# Patient Record
Sex: Female | Born: 1965 | Race: Black or African American | Hispanic: No | Smoking: Former smoker
Health system: Southern US, Community
[De-identification: ages and names within clinical notes are randomized; demographics above are authoritative.]

## PROBLEM LIST (undated history)

## (undated) DIAGNOSIS — C50919 Malignant neoplasm of unspecified site of unspecified female breast: Secondary | ICD-10-CM

## (undated) DIAGNOSIS — I10 Essential (primary) hypertension: Secondary | ICD-10-CM

## (undated) HISTORY — PX: FRACTURE SURGERY: SHX138

## (undated) HISTORY — PX: KNEE ARTHROSCOPY: SUR90

## (undated) HISTORY — PX: BREAST SURGERY: SHX581

## (undated) HISTORY — PX: ABDOMINAL HYSTERECTOMY: SHX81

---

## 2001-12-13 ENCOUNTER — Emergency Department (HOSPITAL_COMMUNITY): Admission: EM | Admit: 2001-12-13 | Discharge: 2001-12-13 | Payer: Self-pay | Admitting: Emergency Medicine

## 2002-03-22 HISTORY — PX: MASTECTOMY: SHX3

## 2002-03-22 HISTORY — PX: ABDOMINAL HYSTERECTOMY: SHX81

## 2002-09-17 DIAGNOSIS — Z9889 Other specified postprocedural states: Secondary | ICD-10-CM | POA: Insufficient documentation

## 2002-09-17 DIAGNOSIS — N993 Prolapse of vaginal vault after hysterectomy: Secondary | ICD-10-CM | POA: Insufficient documentation

## 2004-02-11 ENCOUNTER — Ambulatory Visit: Payer: Self-pay

## 2004-08-24 ENCOUNTER — Emergency Department: Payer: Self-pay | Admitting: Unknown Physician Specialty

## 2004-09-03 ENCOUNTER — Emergency Department: Payer: Self-pay | Admitting: Emergency Medicine

## 2005-03-30 ENCOUNTER — Emergency Department: Payer: Self-pay | Admitting: Internal Medicine

## 2005-06-23 ENCOUNTER — Emergency Department: Payer: Self-pay | Admitting: Emergency Medicine

## 2005-11-21 ENCOUNTER — Emergency Department: Payer: Self-pay

## 2006-06-08 ENCOUNTER — Emergency Department: Payer: Self-pay | Admitting: Emergency Medicine

## 2006-08-08 ENCOUNTER — Ambulatory Visit: Payer: Self-pay | Admitting: Family Medicine

## 2007-08-22 ENCOUNTER — Emergency Department: Payer: Self-pay | Admitting: Emergency Medicine

## 2007-11-05 ENCOUNTER — Emergency Department: Payer: Self-pay | Admitting: Emergency Medicine

## 2007-11-09 ENCOUNTER — Ambulatory Visit: Payer: Self-pay | Admitting: Surgery

## 2008-10-24 ENCOUNTER — Emergency Department: Payer: Self-pay

## 2009-02-25 ENCOUNTER — Ambulatory Visit: Payer: Self-pay | Admitting: Family Medicine

## 2009-04-10 ENCOUNTER — Ambulatory Visit: Payer: Self-pay | Admitting: Urology

## 2009-04-14 ENCOUNTER — Ambulatory Visit: Payer: Self-pay | Admitting: Urology

## 2009-05-27 ENCOUNTER — Emergency Department: Payer: Self-pay | Admitting: Emergency Medicine

## 2010-06-09 ENCOUNTER — Ambulatory Visit: Payer: Self-pay | Admitting: Anesthesiology

## 2010-06-09 ENCOUNTER — Ambulatory Visit: Payer: Self-pay | Admitting: Otolaryngology

## 2010-06-21 ENCOUNTER — Ambulatory Visit: Payer: Self-pay | Admitting: Internal Medicine

## 2012-01-26 ENCOUNTER — Emergency Department: Payer: Self-pay | Admitting: Emergency Medicine

## 2012-01-26 LAB — URINALYSIS, COMPLETE
Glucose,UR: NEGATIVE mg/dL (ref 0–75)
Leukocyte Esterase: NEGATIVE
Nitrite: NEGATIVE
Protein: NEGATIVE
RBC,UR: 1 /HPF (ref 0–5)
Specific Gravity: 1.025 (ref 1.003–1.030)
WBC UR: 2 /HPF (ref 0–5)

## 2012-01-26 LAB — CBC
MCH: 25.5 pg — ABNORMAL LOW (ref 26.0–34.0)
MCV: 79 fL — ABNORMAL LOW (ref 80–100)
Platelet: 178 10*3/uL (ref 150–440)
RDW: 15.3 % — ABNORMAL HIGH (ref 11.5–14.5)

## 2012-01-26 LAB — COMPREHENSIVE METABOLIC PANEL
Anion Gap: 8 (ref 7–16)
Calcium, Total: 9.4 mg/dL (ref 8.5–10.1)
Chloride: 105 mmol/L (ref 98–107)
Creatinine: 0.77 mg/dL (ref 0.60–1.30)
EGFR (African American): >60
EGFR (Non-African Amer.): >60
Glucose: 94 mg/dL (ref 65–99)
Potassium: 3.5 mmol/L (ref 3.5–5.1)
SGOT(AST): 12 U/L — ABNORMAL LOW (ref 15–37)
SGPT (ALT): 29 U/L (ref 12–78)
Total Protein: 8.3 g/dL — ABNORMAL HIGH (ref 6.4–8.2)

## 2012-01-26 LAB — LIPASE, BLOOD: Lipase: 242 U/L (ref 73–393)

## 2012-11-24 DIAGNOSIS — C50919 Malignant neoplasm of unspecified site of unspecified female breast: Secondary | ICD-10-CM | POA: Insufficient documentation

## 2013-04-03 ENCOUNTER — Ambulatory Visit: Payer: Self-pay | Admitting: Specialist

## 2013-05-21 ENCOUNTER — Ambulatory Visit: Payer: Self-pay | Admitting: Unknown Physician Specialty

## 2013-05-21 DIAGNOSIS — I10 Essential (primary) hypertension: Secondary | ICD-10-CM

## 2013-05-21 LAB — POTASSIUM: Potassium: 3.3 mmol/L — ABNORMAL LOW (ref 3.5–5.1)

## 2013-05-23 ENCOUNTER — Ambulatory Visit: Payer: Self-pay | Admitting: Specialist

## 2014-04-06 ENCOUNTER — Emergency Department: Payer: Self-pay | Admitting: Emergency Medicine

## 2014-04-06 LAB — BASIC METABOLIC PANEL
Anion Gap: 7 (ref 7–16)
BUN: 18 mg/dL (ref 7–18)
CHLORIDE: 101 mmol/L (ref 98–107)
Calcium, Total: 9.3 mg/dL (ref 8.5–10.1)
Co2: 33 mmol/L — ABNORMAL HIGH (ref 21–32)
Creatinine: 0.72 mg/dL (ref 0.60–1.30)
Glucose: 109 mg/dL — ABNORMAL HIGH (ref 65–99)
Osmolality: 284 (ref 275–301)
Potassium: 2.7 mmol/L — ABNORMAL LOW (ref 3.5–5.1)
SODIUM: 141 mmol/L (ref 136–145)

## 2014-04-06 LAB — CK: CK, TOTAL: 145 U/L (ref 26–192)

## 2014-07-10 ENCOUNTER — Emergency Department
Admit: 2014-07-10 | Disposition: A | Discharge status: Home / Self Care | Payer: Self-pay | Admitting: Emergency Medicine

## 2014-07-10 ENCOUNTER — Emergency Department: Admit: 2014-07-10 | Discharge status: Against Medical Advice | Payer: Self-pay | Admitting: Student

## 2014-07-10 LAB — CBC WITH DIFFERENTIAL/PLATELET
BASOS PCT: 1.1 %
Basophil #: 0.1 10*3/uL (ref 0.0–0.1)
EOS PCT: 2.1 %
Eosinophil #: 0.2 10*3/uL (ref 0.0–0.7)
HCT: 40 % (ref 35.0–47.0)
HGB: 13 g/dL (ref 12.0–16.0)
LYMPHS PCT: 39.7 %
Lymphocyte #: 3 10*3/uL (ref 1.0–3.6)
MCH: 25.3 pg — ABNORMAL LOW (ref 26.0–34.0)
MCHC: 32.6 g/dL (ref 32.0–36.0)
MCV: 78 fL — ABNORMAL LOW (ref 80–100)
Monocyte #: 0.4 x10 3/mm (ref 0.2–0.9)
Monocyte %: 5.9 %
NEUTROS ABS: 3.9 10*3/uL (ref 1.4–6.5)
Neutrophil %: 51.2 %
Platelet: 167 10*3/uL (ref 150–440)
RBC: 5.15 10*6/uL (ref 3.80–5.20)
RDW: 15.2 % — ABNORMAL HIGH (ref 11.5–14.5)
WBC: 7.5 10*3/uL (ref 3.6–11.0)

## 2014-07-10 LAB — COMPREHENSIVE METABOLIC PANEL
ALK PHOS: 85 U/L
ALT: 21 U/L
ANION GAP: 9 (ref 7–16)
AST: 20 U/L
Albumin: 4.7 g/dL
BUN: 16 mg/dL
Bilirubin,Total: 0.3 mg/dL
CALCIUM: 10.5 mg/dL — AB
Chloride: 99 mmol/L — ABNORMAL LOW
Co2: 32 mmol/L
Creatinine: 0.72 mg/dL
EGFR (Non-African Amer.): >60
GLUCOSE: 106 mg/dL — AB
POTASSIUM: 2.7 mmol/L — AB
SODIUM: 140 mmol/L
Total Protein: 8.6 g/dL — ABNORMAL HIGH

## 2014-07-13 NOTE — Op Note (Signed)
PATIENT NAME:  Jacqueline Bowman, Jacqueline Bowman MR#:  [id number] DATE OF BIRTH:  [birth year]  DATE OF PROCEDURE:  05/23/2013  PREOPERATIVE DIAGNOSES: 1.  Grade IV chondral defect, right medial femoral condyle. 2.  Extensive synovitis of the knee.  3.  Grade II chondromalacia, patellofemoral joint.   POSTOPERATIVE DIAGNOSES: 1.  Grade IV chondral defect, right medial femoral condyle. 2.  Extensive synovitis of the knee.  3.  Grade II chondromalacia, patellofemoral joint.   PROCEDURES: 1.  Microfracture, right medial femoral condyle.  2.  Extensive synovectomy, right knee.   SURGEON: Park Breed, MD.   ANESTHESIA: General LMA.   COMPLICATIONS: None.   DRAINS: None.   ESTIMATED BLOOD LOSS: Minimal.  REPLACEMENTS:  None.   OPERATIVE FINDINGS: The patient had a 2 x 2 cm grade IV chondral lesion in a weight-bearing surface on the medial femoral condyle. There was extensive synovitis anteriorly and with plica medially. The patellofemoral joint had grade II chondromalacia. The menisci were intact. The lateral compartment was intact. There were no large loose bodies other than loose floating bodies of chondral material. The suprapatellar pouch was intact. Gutters were intact.   OPERATIVE PROCEDURE: The patient was brought to the operating room where she underwent satisfactory general LMA anesthesia in the supine position. The right knee was prepped and draped in sterile fashion and arthroscopy carried out through standard portals. The above findings were encountered upon arthroscopy. A great deal of time was taken to clear out the anterior portion of the knee anteriorly and laterally and medially. Medial plica was resected. There was a large full-thickness chondral defect in the medial femoral condyle. After surveying the knee and finishing synovectomy, the patellofemoral joint was lightly touched up with the ArthroCare wand at the lowest setting. The edges of the chondral defect in the medial femoral  condyle were shaved back to stable tissue. The 0.62 K wire was then used to drill multiple holes in condyle which provided good bleeding bone. After this had been completed and pictures taken, the knee was again irrigated. The stab wounds were closed with 3-0 nylon suture. Marcaine 0.5% with epinephrine and morphine was placed in the joint. Dry sterile dressing was applied. The tourniquet was not used. The patient was awakened and taken to recovery in good condition.    ____________________________ Park Breed, MD hem:ce D: 05/23/2013 09:13:47 ET T: 05/23/2013 14:21:39 ET JOB#: [id number]  cc: Park Breed, MD, <Dictator> Park Breed MD ELECTRONICALLY SIGNED 05/24/2013 7:40

## 2015-02-02 ENCOUNTER — Encounter: Payer: Self-pay | Admitting: *Deleted

## 2015-02-02 ENCOUNTER — Emergency Department: Payer: Private Health Insurance - Indemnity

## 2015-02-02 ENCOUNTER — Emergency Department
Admission: EM | Admit: 2015-02-02 | Discharge: 2015-02-02 | Disposition: A | Discharge status: Home / Self Care | Payer: Private Health Insurance - Indemnity | Attending: Emergency Medicine | Admitting: Emergency Medicine

## 2015-02-02 DIAGNOSIS — S0990XA Unspecified injury of head, initial encounter: Secondary | ICD-10-CM

## 2015-02-02 DIAGNOSIS — Y9389 Activity, other specified: Secondary | ICD-10-CM | POA: Diagnosis not present

## 2015-02-02 DIAGNOSIS — Y92410 Unspecified street and highway as the place of occurrence of the external cause: Secondary | ICD-10-CM | POA: Insufficient documentation

## 2015-02-02 DIAGNOSIS — Y998 Other external cause status: Secondary | ICD-10-CM | POA: Insufficient documentation

## 2015-02-02 HISTORY — DX: Malignant neoplasm of unspecified site of unspecified female breast: C50.919

## 2015-02-02 MED ORDER — IBUPROFEN 800 MG PO TABS: 800.0000 mg | ORAL_TABLET | Freq: Three times a day (TID) | ORAL | Status: DC | PRN

## 2015-02-02 MED ORDER — OXYCODONE-ACETAMINOPHEN 5-325 MG PO TABS
2.0000 | ORAL_TABLET | Freq: Once | ORAL | Status: AC
  Administered 2015-02-02: 2 via ORAL
  Filled 2015-02-02: qty 2
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
## 2015-02-02 NOTE — Discharge Instructions (Signed)
Head Injury, Adult °You have a head injury. Headaches and throwing up (vomiting) are common after a head injury. It should be easy to wake up from sleeping. Sometimes you must stay in the hospital. Most problems happen within the first 24 hours. Side effects may occur up to 7-10 days after the injury.  °WHAT ARE THE TYPES OF HEAD INJURIES? °Head injuries can be as minor as a bump. Some head injuries can be more severe. More severe head injuries include: °· A jarring injury to the brain (concussion). °· A bruise of the brain (contusion). This mean there is bleeding in the brain that can cause swelling. °· A cracked skull (skull fracture). °· Bleeding in the brain that collects, clots, and forms a bump (hematoma). °WHEN SHOULD I GET HELP RIGHT AWAY?  °· You are confused or sleepy. °· You cannot be woken up. °· You feel sick to your stomach (nauseous) or keep throwing up (vomiting). °· Your dizziness or unsteadiness is getting worse. °· You have very bad, lasting headaches that are not helped by medicine. Take medicines only as told by your doctor. °· You cannot use your arms or legs like normal. °· You cannot walk. °· You notice changes in the black spots in the center of the colored part of your eye (pupil). °· You have clear or bloody fluid coming from your nose or ears. °· You have trouble seeing. °During the next 24 hours after the injury, you must stay with someone who can watch you. This person should get help right away (call 911 in the [place] if you start to shake and are not able to control it (have seizures), you pass out, or you are unable to wake up. °HOW CAN I PREVENT A HEAD INJURY IN THE FUTURE? °· Wear seat belts. °· Wear a helmet while bike riding and playing sports like football. °· Stay away from dangerous activities around the house. °WHEN CAN I RETURN TO NORMAL ACTIVITIES AND ATHLETICS? °See your doctor before doing these activities. You should not do normal activities or play contact sports until 1  week after the following symptoms have stopped: °· Headache that does not go away. °· Dizziness. °· Poor attention. °· Confusion. °· Memory problems. °· Sickness to your stomach or throwing up. °· Tiredness. °· Fussiness. °· Bothered by bright lights or loud noises. °· Anxiousness or depression. °· Restless sleep. °MAKE SURE YOU:  °· Understand these instructions. °· Will watch your condition. °· Will get help right away if you are not doing well or get worse. °  °This information is not intended to replace advice given to you by your health care provider. Make sure you discuss any questions you have with your health care provider. °  °Document Released: 02/19/2008 Document Revised: 03/29/2014 Document Reviewed: 11/13/2012 °Elsevier Interactive Patient Education ©2016 [employer] ° °

## 2015-02-14 ENCOUNTER — Emergency Department
Admission: EM | Admit: 2015-02-14 | Discharge: 2015-02-14 | Disposition: A | Discharge status: Home / Self Care | Payer: Private Health Insurance - Indemnity | Attending: Emergency Medicine | Admitting: Emergency Medicine

## 2015-02-14 ENCOUNTER — Emergency Department: Payer: Private Health Insurance - Indemnity

## 2015-02-14 DIAGNOSIS — Z96651 Presence of right artificial knee joint: Secondary | ICD-10-CM | POA: Diagnosis not present

## 2015-02-14 DIAGNOSIS — M25561 Pain in right knee: Secondary | ICD-10-CM | POA: Insufficient documentation

## 2015-02-14 MED ORDER — OXYCODONE-ACETAMINOPHEN 7.5-325 MG PO TABS
1.0000 | ORAL_TABLET | ORAL | Status: DC | PRN
Start: 2015-02-14 — End: 2016-11-22

## 2015-02-14 MED ORDER — OXYCODONE-ACETAMINOPHEN 5-325 MG PO TABS
2.0000 | ORAL_TABLET | Freq: Once | ORAL | Status: AC
  Administered 2015-02-14: 2 via ORAL
  Filled 2015-02-14: qty 2

## 2015-02-14 NOTE — ED Provider Notes (Signed)
[facility] Emergency Department Provider Note  ____________________________________________  Time seen: Approximately 12:30 PM  I have reviewed the triage vital signs and the nursing notes.   HISTORY  Chief Complaint Knee Pain    HPI Jacqueline Bowman is a 49 y.o. female patient complains of 2 weeks of right knee pain. Patient stated the pain is on the lateral aspect of her knee. She state she's had a knee replacement 8 months ago. Patient states sensation of loose body in the lateral aspect of her knee. knee. Patient denies any recent trauma. Patient rates the pain as a 9/10. No palliative measures taken for this complaint.  Past Medical History  Diagnosis Date  . Breast cancer [facility])     January 2001    There are no active problems to display for this patient.   Past Surgical History  Procedure Laterality Date  . Knee arthroscopy    . Fracture surgery    . Abdominal hysterectomy      Current Outpatient Rx  Name  Route  Sig  Dispense  Refill  . ibuprofen (ADVIL,MOTRIN) 800 MG tablet   Oral   Take 1 tablet (800 mg total) by mouth every 8 (eight) hours as needed.   30 tablet   0     Allergies Review of patient's allergies indicates no known allergies.  No family history on file.  Social History Social History  Substance Use Topics  . Smoking status: Never Smoker   . Smokeless tobacco: None  . Alcohol Use: No     Comment: socially    Review of Systems Constitutional: No fever/chills Eyes: No visual changes. ENT: No sore throat. Cardiovascular: Denies chest pain. Respiratory: Denies shortness of breath. Gastrointestinal: No abdominal pain.  No nausea, no vomiting.  No diarrhea.  No constipation. Genitourinary: Negative for dysuria. Musculoskeletal: Right knee pain  Skin: Negative for rash. Neurological: Negative for headaches, focal weakness or numbness. 10-point ROS otherwise  negative.  ____________________________________________   PHYSICAL EXAM:  VITAL SIGNS: ED Triage Vitals  Enc Vitals Group     BP 02/14/15 1146 113/77 mmHg     Pulse Rate 02/14/15 1146 94     Resp 02/14/15 1146 18     Temp 02/14/15 1146 97.8 F (36.6 C)     Temp Source 02/14/15 1146 Oral     SpO2 02/14/15 1146 98 %     Weight 02/14/15 1142 206 lb (93.441 kg)     Height 02/14/15 1142 5\' 5"  (1.651 m)     Head Cir --      Peak Flow --      Pain Score 02/14/15 1142 9     Pain Loc --      Pain Edu? --      Excl. in [place]? --     Constitutional: Alert and oriented. Well appearing and in no acute distress. Eyes: Conjunctivae are normal. PERRL. EOMI. Head: Atraumatic. Nose: No congestion/rhinnorhea. Mouth/Throat: Mucous membranes are moist.  Oropharynx non-erythematous. Neck: No stridor. No cervical spine tenderness to palpation. Hematological/Lymphatic/Immunilogical: No cervical lymphadenopathy. Cardiovascular: Normal rate, regular rhythm. Grossly normal heart sounds.  Good peripheral circulation. Respiratory: Normal respiratory effort.  No retractions. Lungs CTAB. Gastrointestinal: Soft and nontender. No distention. No abdominal bruits. No CVA tenderness. Musculoskeletal: No DEFORMITY of the right knee. Surgical scar consistent with history. Patient is tender palpation lateral insertion point of the collateral ligament.  Neurologic:  Normal speech and language. No gross focal neurologic deficits are appreciated. No gait instability.  Skin:  Skin is warm, dry and intact. No rash noted. Psychiatric: Mood and affect are normal. Speech and behavior are normal.  ____________________________________________   LABS (all labs ordered are listed, but only abnormal results are displayed)  Labs Reviewed - No data to display ____________________________________________  EKG   ____________________________________________  RADIOLOGY No acute findings on right and the x-ray. Radiologist  cannot rule out loose bodies seen at person films.Hardware appears to be intact. I, Sable Feil, personally viewed and evaluated these images (plain radiographs) as part of my medical decision making.    ____________________________________________   PROCEDURES  Procedure(s) performed: None  Critical Care performed: No  ____________________________________________   INITIAL IMPRESSION / ASSESSMENT AND PLAN / ED COURSE  Pertinent labs & imaging results that were available during my care of the patient were reviewed by me and considered in my medical decision making (see chart for details).  ___Right knee pain status post knee replacement. Discussed x-ray findings with patient. Patient given a CD of her x-ray to take to a treatment orthopedics for definitive evaluation and treatment. _________________________________________   FINAL CLINICAL IMPRESSION(S) / ED DIAGNOSES  Final diagnoses:  Right knee pain      Sable Feil, PA-C 02/14/15 [place], MD 02/15/15 [phone]

## 2015-02-14 NOTE — Discharge Instructions (Signed)
Advised to follow-up with treatment orthopedic Dr.

## 2015-02-14 NOTE — ED Notes (Signed)
Pt given copy of disc at discharge.

## 2015-02-14 NOTE — ED Notes (Signed)
Pt c/o right knee pain for the past 2 weeks. Denies injury

## 2015-04-11 DIAGNOSIS — Z1501 Genetic susceptibility to malignant neoplasm of breast: Secondary | ICD-10-CM | POA: Insufficient documentation

## 2015-06-01 ENCOUNTER — Encounter: Payer: Self-pay | Admitting: Emergency Medicine

## 2015-06-01 ENCOUNTER — Emergency Department
Admission: EM | Admit: 2015-06-01 | Discharge: 2015-06-01 | Disposition: A | Discharge status: Home / Self Care | Payer: Managed Care, Other (non HMO) | Attending: Emergency Medicine | Admitting: Emergency Medicine

## 2015-06-01 DIAGNOSIS — F1110 Opioid abuse, uncomplicated: Secondary | ICD-10-CM | POA: Insufficient documentation

## 2015-06-01 DIAGNOSIS — N12 Tubulo-interstitial nephritis, not specified as acute or chronic: Secondary | ICD-10-CM

## 2015-06-01 DIAGNOSIS — R319 Hematuria, unspecified: Secondary | ICD-10-CM | POA: Diagnosis present

## 2015-06-01 LAB — CBC WITH DIFFERENTIAL/PLATELET
Basophils Absolute: 0.1 10*3/uL (ref 0–0.1)
Basophils Relative: 1 %
EOS ABS: 0 10*3/uL (ref 0–0.7)
EOS PCT: 0 %
HEMATOCRIT: 37.3 % (ref 35.0–47.0)
Hemoglobin: 12 g/dL (ref 12.0–16.0)
Lymphocytes Relative: 17 %
Lymphs Abs: 1.6 10*3/uL (ref 1.0–3.6)
MCH: 24.5 pg — ABNORMAL LOW (ref 26.0–34.0)
MCHC: 32.1 g/dL (ref 32.0–36.0)
MCV: 76.3 fL — ABNORMAL LOW (ref 80.0–100.0)
MONOS PCT: 9 %
Monocytes Absolute: 0.8 10*3/uL (ref 0.2–0.9)
Neutro Abs: 7 10*3/uL — ABNORMAL HIGH (ref 1.4–6.5)
Neutrophils Relative %: 73 %
Platelets: 193 10*3/uL (ref 150–440)
RBC: 4.89 MIL/uL (ref 3.80–5.20)
RDW: 15 % — ABNORMAL HIGH (ref 11.5–14.5)
WBC: 9.6 10*3/uL (ref 3.6–11.0)

## 2015-06-01 LAB — COMPREHENSIVE METABOLIC PANEL
ALBUMIN: 4.2 g/dL (ref 3.5–5.0)
ALK PHOS: 111 U/L (ref 38–126)
ALT: 41 U/L (ref 14–54)
ANION GAP: 10 (ref 5–15)
AST: 42 U/L — ABNORMAL HIGH (ref 15–41)
BILIRUBIN TOTAL: 0.4 mg/dL (ref 0.3–1.2)
BUN: 14 mg/dL (ref 6–20)
CALCIUM: 9.2 mg/dL (ref 8.9–10.3)
CO2: 31 mmol/L (ref 22–32)
Chloride: 93 mmol/L — ABNORMAL LOW (ref 101–111)
Creatinine, Ser: 0.97 mg/dL (ref 0.44–1.00)
GFR calc non Af Amer: >60 mL/min (ref >60)
GLUCOSE: 114 mg/dL — AB (ref 65–99)
POTASSIUM: 2.4 mmol/L — AB (ref 3.5–5.1)
SODIUM: 134 mmol/L — AB (ref 135–145)
TOTAL PROTEIN: 8.9 g/dL — AB (ref 6.5–8.1)

## 2015-06-01 LAB — URINALYSIS COMPLETE WITH MICROSCOPIC (ARMC ONLY)
Bilirubin Urine: NEGATIVE
Glucose, UA: NEGATIVE mg/dL
KETONES UR: NEGATIVE mg/dL
Nitrite: POSITIVE — AB
PROTEIN: 30 mg/dL — AB
Specific Gravity, Urine: 1.01 (ref 1.005–1.030)
pH: 6 (ref 5.0–8.0)

## 2015-06-01 LAB — LIPASE, BLOOD: Lipase: 17 U/L (ref 11–51)

## 2015-06-01 MED ORDER — HYDROMORPHONE HCL 1 MG/ML IJ SOLN
1.0000 mg | Freq: Once | INTRAMUSCULAR | Status: AC
  Administered 2015-06-01: 1 mg via INTRAVENOUS
  Filled 2015-06-01: qty 1

## 2015-06-01 MED ORDER — POTASSIUM CHLORIDE CRYS ER 20 MEQ PO TBCR
60.0000 meq | EXTENDED_RELEASE_TABLET | Freq: Once | ORAL | Status: AC
  Administered 2015-06-01: 60 meq via ORAL
  Filled 2015-06-01: qty 3

## 2015-06-01 MED ORDER — DEXTROSE 5 % IV SOLN
1.0000 g | Freq: Once | INTRAVENOUS | Status: AC
  Administered 2015-06-01: 1 g via INTRAVENOUS
  Filled 2015-06-01: qty 10

## 2015-06-01 MED ORDER — ONDANSETRON HCL 4 MG/2ML IJ SOLN
4.0000 mg | Freq: Once | INTRAMUSCULAR | Status: AC
  Administered 2015-06-01: 4 mg via INTRAVENOUS
  Filled 2015-06-01: qty 2

## 2015-06-01 MED ORDER — SODIUM CHLORIDE 0.9 % IV BOLUS (SEPSIS)
1000.0000 mL | Freq: Once | INTRAVENOUS | Status: AC
  Administered 2015-06-01: 1000 mL via INTRAVENOUS

## 2015-06-01 MED ORDER — ONDANSETRON 4 MG PO TBDP: 4.0000 mg | ORAL_TABLET | Freq: Three times a day (TID) | ORAL | Status: DC | PRN

## 2015-06-01 MED ORDER — SULFAMETHOXAZOLE-TRIMETHOPRIM 800-160 MG PO TABS: 1.0000 | ORAL_TABLET | Freq: Two times a day (BID) | ORAL | Status: DC

## 2015-06-01 NOTE — ED Notes (Addendum)
Pt potassium level 2.4. MD notified.

## 2015-06-01 NOTE — Discharge Instructions (Signed)

## 2015-06-01 NOTE — ED Provider Notes (Addendum)
[facility] Emergency Department Provider Note  ____________________________________________  Time seen: 8:20 AM  I have reviewed the triage vital signs and the nursing notes.   HISTORY  Chief Complaint Hematuria    HPI CORISSA PUCCINI is a 50 y.o. female who complains of hematuria and left flank pain for the past 4 days. Also had a subjective fever yesterday. Also having some urinary urgency and dysuria. 2 weeks ago she tried taking a sitz bath as a cleansing regimen and use baking soda and salt water in the bath. She thinks that the symptoms started as a result of that. Has some nausea but no vomiting, no change in bowel habits. No aggravating or alleviating factors, nonradiating. Moderate intensity.   No blood thinner use  Past Medical History  Diagnosis Date  . Breast cancer [facility])     January 2001     There are no active problems to display for this patient.    Past Surgical History  Procedure Laterality Date  . Knee arthroscopy    . Fracture surgery    . Abdominal hysterectomy       Current Outpatient Rx  Name  Route  Sig  Dispense  Refill  . ibuprofen (ADVIL,MOTRIN) 800 MG tablet   Oral   Take 1 tablet (800 mg total) by mouth every 8 (eight) hours as needed.   30 tablet   0   . ondansetron (ZOFRAN ODT) 4 MG disintegrating tablet   Oral   Take 1 tablet (4 mg total) by mouth every 8 (eight) hours as needed for nausea or vomiting.   20 tablet   0   . oxyCODONE-acetaminophen (PERCOCET) 7.5-325 MG tablet   Oral   Take 1 tablet by mouth every 4 (four) hours as needed for severe pain.   20 tablet   0   . sulfamethoxazole-trimethoprim (BACTRIM DS) 800-160 MG tablet   Oral   Take 1 tablet by mouth 2 (two) times daily.   14 tablet   0      Allergies Review of patient's allergies indicates no known allergies.   History reviewed. No pertinent family history.  Social History Social History  Substance Use Topics  . Smoking  status: Never Smoker   . Smokeless tobacco: None  . Alcohol Use: No     Comment: socially    Review of Systems  Constitutional:  Positive fever.. No weight changes Eyes:   No blurry vision or double vision.  ENT:   No sore throat.  Cardiovascular:   No chest pain. Respiratory:   No dyspnea or cough. Gastrointestinal:   Negative for abdominal pain, vomiting and diarrhea.  Chronic occasional bleeding with bowel movements due to straining and hemorrhoids and chronic opioid use.. Genitourinary:   Positive dysuria urgency and hematuria. Musculoskeletal:   Negative for back pain. No joint swelling or pain. Skin:   Negative for rash. Neurological:   Negative for headaches, focal weakness or numbness. Psychiatric:  No anxiety or depression.   Endocrine:  No changes in energy or sleep difficulty.  10-point ROS otherwise negative.  ____________________________________________   PHYSICAL EXAM:  VITAL SIGNS: ED Triage Vitals  Enc Vitals Group     BP 06/01/15 0819 122/85 mmHg     Pulse Rate 06/01/15 0819 90     Resp 06/01/15 0819 20     Temp 06/01/15 0819 98.2 F (36.8 C)     Temp Source 06/01/15 0819 Oral     SpO2 06/01/15 0819 99 %  Weight 06/01/15 0819 190 lb (86.183 kg)     Height 06/01/15 0819 5\' 6"  (1.676 m)     Head Cir --      Peak Flow --      Pain Score 06/01/15 0819 7     Pain Loc --      Pain Edu? --      Excl. in [place]? --     Vital signs reviewed, nursing assessments reviewed.   Constitutional:   Alert and oriented. Well appearing and in no distress. Eyes:   No scleral icterus. No conjunctival pallor. PERRL. EOMI ENT   Head:   Normocephalic and atraumatic.   Nose:   No congestion/rhinnorhea. No septal hematoma   Mouth/Throat:   MMM, no pharyngeal erythema. No peritonsillar mass.    Neck:   No stridor. No SubQ emphysema. No meningismus. Hematological/Lymphatic/Immunilogical:   No cervical lymphadenopathy. Cardiovascular:   RRR. Symmetric  bilateral radial and DP pulses.  No murmurs.  Respiratory:   Normal respiratory effort without tachypnea nor retractions. Breath sounds are clear and equal bilaterally. No wheezes/rales/rhonchi. Gastrointestinal:   Soft With diffuse lower abdominal tenderness. Non distended. There is left CVA tenderness.  No rebound, rigidity, or guarding. Genitourinary:   deferred Musculoskeletal:   Nontender with normal range of motion in all extremities. No joint effusions.  No lower extremity tenderness.  No edema. Neurologic:   Normal speech and language.  CN 2-10 normal. Motor grossly intact. No gross focal neurologic deficits are appreciated.  Skin:    Skin is warm, dry and intact. No rash noted.  No petechiae, purpura, or bullae. Psychiatric:   Mood and affect are normal. ____________________________________________    LABS (pertinent positives/negatives) (all labs ordered are listed, but only abnormal results are displayed) Labs Reviewed  COMPREHENSIVE METABOLIC PANEL - Abnormal; Notable for the following:    Sodium 134 (*)    Potassium 2.4 (*)    Chloride 93 (*)    Glucose, Bld 114 (*)    Total Protein 8.9 (*)    AST 42 (*)    All other components within normal limits  CBC WITH DIFFERENTIAL/PLATELET - Abnormal; Notable for the following:    MCV 76.3 (*)    MCH 24.5 (*)    RDW 15.0 (*)    Neutro Abs 7.0 (*)    All other components within normal limits  URINALYSIS COMPLETEWITH MICROSCOPIC ([facility] ONLY) - Abnormal; Notable for the following:    Color, Urine YELLOW (*)    APPearance CLOUDY (*)    Hgb urine dipstick 3+ (*)    Protein, ur 30 (*)    Nitrite POSITIVE (*)    Leukocytes, UA 3+ (*)    Bacteria, UA FEW (*)    Squamous Epithelial / LPF 0-5 (*)    All other components within normal limits  URINE CULTURE  LIPASE, BLOOD   ____________________________________________   EKG    ____________________________________________     RADIOLOGY    ____________________________________________   PROCEDURES   ____________________________________________   INITIAL IMPRESSION / ASSESSMENT AND PLAN / ED COURSE  Pertinent labs & imaging results that were available during my care of the patient were reviewed by me and considered in my medical decision making (see chart for details).  Patient presents with urinary complaints and left flank pain with CVA tenderness. Suspect urinary tract infection, possible pyelonephritis although her vital signs are normal and there is no evidence of sepsis. It's and urinalysis. If negative, but with significant abdominal tenderness we'll proceed to  CT scan. Medical screening examination/treatment/procedure(s) were performed by non-physician practitioner and as supervising physician I was immediately available for consultation/collaboration.    ----------------------------------------- 10:05 AM on 06/01/2015 -----------------------------------------  Workup reveals hypokalemia which was repleted here in the ED as well as a clear urinary tract infection. With her symptoms this is consistent with left-sided pyelonephritis although her vital signs are normal she is well appearing and tolerating oral intake. Patient given IV ceftriaxone in the ED. We'll prescribe a course of Bactrim and Zofran and have her follow up with primary care.   ____________________________________________   FINAL CLINICAL IMPRESSION(S) / ED DIAGNOSES  Final diagnoses:  Pyelonephritis      Carrie Mew, MD 06/01/15 [place], MD 06/01/15 1005

## 2015-06-01 NOTE — ED Notes (Signed)
Reports blood in urine and left flank pain.  States yesterday had fever.

## 2015-06-01 NOTE — ED Notes (Signed)
Pt rated pain 8/10 upon discharge. Pt given pain med before leaving. Pt has a ride.

## 2015-06-03 LAB — URINE CULTURE: Culture: >=100000

## 2015-06-18 ENCOUNTER — Encounter: Payer: Self-pay | Admitting: Emergency Medicine

## 2015-06-18 ENCOUNTER — Emergency Department
Admission: EM | Admit: 2015-06-18 | Discharge: 2015-06-18 | Disposition: A | Discharge status: Home / Self Care | Payer: Managed Care, Other (non HMO) | Attending: Emergency Medicine | Admitting: Emergency Medicine

## 2015-06-18 ENCOUNTER — Emergency Department: Payer: Managed Care, Other (non HMO)

## 2015-06-18 DIAGNOSIS — E876 Hypokalemia: Secondary | ICD-10-CM | POA: Diagnosis not present

## 2015-06-18 DIAGNOSIS — R0600 Dyspnea, unspecified: Secondary | ICD-10-CM | POA: Diagnosis not present

## 2015-06-18 DIAGNOSIS — M79604 Pain in right leg: Secondary | ICD-10-CM | POA: Diagnosis not present

## 2015-06-18 DIAGNOSIS — I10 Essential (primary) hypertension: Secondary | ICD-10-CM | POA: Insufficient documentation

## 2015-06-18 DIAGNOSIS — R0602 Shortness of breath: Secondary | ICD-10-CM | POA: Diagnosis present

## 2015-06-18 HISTORY — DX: Essential (primary) hypertension: I10

## 2015-06-18 LAB — URINALYSIS COMPLETE WITH MICROSCOPIC (ARMC ONLY)
Bilirubin Urine: NEGATIVE
GLUCOSE, UA: NEGATIVE mg/dL
HGB URINE DIPSTICK: NEGATIVE
Ketones, ur: NEGATIVE mg/dL
Nitrite: NEGATIVE
PH: 7 (ref 5.0–8.0)
Protein, ur: NEGATIVE mg/dL
Specific Gravity, Urine: 1.016 (ref 1.005–1.030)

## 2015-06-18 LAB — BASIC METABOLIC PANEL
Anion gap: 7 (ref 5–15)
BUN: 16 mg/dL (ref 6–20)
CALCIUM: 9.6 mg/dL (ref 8.9–10.3)
CHLORIDE: 96 mmol/L — AB (ref 101–111)
CO2: 32 mmol/L (ref 22–32)
CREATININE: 0.86 mg/dL (ref 0.44–1.00)
GFR calc non Af Amer: >60 mL/min (ref >60)
GLUCOSE: 100 mg/dL — AB (ref 65–99)
Potassium: 2.4 mmol/L — CL (ref 3.5–5.1)
Sodium: 135 mmol/L (ref 135–145)

## 2015-06-18 LAB — CBC
HCT: 37.2 % (ref 35.0–47.0)
Hemoglobin: 12.2 g/dL (ref 12.0–16.0)
MCH: 24.9 pg — AB (ref 26.0–34.0)
MCHC: 32.8 g/dL (ref 32.0–36.0)
MCV: 75.9 fL — AB (ref 80.0–100.0)
PLATELETS: 179 10*3/uL (ref 150–440)
RBC: 4.91 MIL/uL (ref 3.80–5.20)
RDW: 15.4 % — ABNORMAL HIGH (ref 11.5–14.5)
WBC: 5.3 10*3/uL (ref 3.6–11.0)

## 2015-06-18 LAB — BRAIN NATRIURETIC PEPTIDE: B NATRIURETIC PEPTIDE 5: 6 pg/mL (ref 0.0–100.0)

## 2015-06-18 LAB — TROPONIN I

## 2015-06-18 MED ORDER — IOPAMIDOL (ISOVUE-370) INJECTION 76%
75.0000 mL | Freq: Once | INTRAVENOUS | Status: AC | PRN
  Administered 2015-06-18: 75 mL via INTRAVENOUS

## 2015-06-18 MED ORDER — POTASSIUM CHLORIDE ER 10 MEQ PO TBCR: 10.0000 meq | EXTENDED_RELEASE_TABLET | Freq: Every day | ORAL | Status: DC

## 2015-06-18 MED ORDER — POTASSIUM CHLORIDE CRYS ER 20 MEQ PO TBCR
60.0000 meq | EXTENDED_RELEASE_TABLET | Freq: Once | ORAL | Status: AC
  Administered 2015-06-18: 60 meq via ORAL
  Filled 2015-06-18: qty 3

## 2015-06-18 NOTE — ED Notes (Addendum)
Surgery to right leg x 2 weeks ago; cramping intermittently since. Pt concerned about blood clot.  Pt also finishing antibiotics for UTI.

## 2015-06-18 NOTE — Discharge Instructions (Signed)
Shortness of Breath Shortness of breath means you have trouble breathing. It could also mean that you have a medical problem. You should get immediate medical care for shortness of breath. CAUSES   Not enough oxygen in the air such as with high altitudes or a smoke-filled room.  Certain lung diseases, infections, or problems.  Heart disease or conditions, such as angina or heart failure.  Low red blood cells (anemia).  Poor physical fitness, which can cause shortness of breath when you exercise.  Chest or back injuries or stiffness.  Being overweight.  Smoking.  Anxiety, which can make you feel like you are not getting enough air. DIAGNOSIS  Serious medical problems can often be found during your physical exam. Tests may also be done to determine why you are having shortness of breath. Tests may include:  Chest X-rays.  Lung function tests.  Blood tests.  An electrocardiogram (ECG).  An ambulatory electrocardiogram. An ambulatory ECG records your heartbeat patterns over a 24-hour period.  Exercise testing.  A transthoracic echocardiogram (TTE). During echocardiography, sound waves are used to evaluate how blood flows through your heart.  A transesophageal echocardiogram (TEE).  Imaging scans. Your health care provider may not be able to find a cause for your shortness of breath after your exam. In this case, it is important to have a follow-up exam with your health care provider as directed.  TREATMENT  Treatment for shortness of breath depends on the cause of your symptoms and can vary greatly. HOME CARE INSTRUCTIONS   Do not smoke. Smoking is a common cause of shortness of breath. If you smoke, ask for help to quit.  Avoid being around chemicals or things that may bother your breathing, such as paint fumes and dust.  Rest as needed. Slowly resume your usual activities.  If medicines were prescribed, take them as directed for the full length of time directed. This  includes oxygen and any inhaled medicines.  Keep all follow-up appointments as directed by your health care provider. SEEK MEDICAL CARE IF:   Your condition does not improve in the time expected.  You have a hard time doing your normal activities even with rest.  You have any new symptoms. SEEK IMMEDIATE MEDICAL CARE IF:   Your shortness of breath gets worse.  You feel light-headed, faint, or develop a cough not controlled with medicines.  You start coughing up blood.  You have pain with breathing.  You have chest pain or pain in your arms, shoulders, or abdomen.  You have a fever.  You are unable to walk up stairs or exercise the way you normally do. MAKE SURE YOU:  Understand these instructions.  Will watch your condition.  Will get help right away if you are not doing well or get worse.   This information is not intended to replace advice given to you by your health care provider. Make sure you discuss any questions you have with your health care provider.   Document Released: 12/01/2000 Document Revised: 03/13/2013 Document Reviewed: 05/24/2011 Elsevier Interactive Patient Education 2016 [employer].  Hypokalemia Hypokalemia means that the amount of potassium in the blood is lower than normal.Potassium is a chemical, called an electrolyte, that helps regulate the amount of fluid in the body. It also stimulates muscle contraction and helps nerves function properly.Most of the body's potassium is inside of cells, and only a very small amount is in the blood. Because the amount in the blood is so small, minor changes can be  life-threatening. CAUSES  Antibiotics.  Diarrhea or vomiting.  Using laxatives too much, which can cause diarrhea.  Chronic kidney disease.  Water pills (diuretics).  Eating disorders (bulimia).  Low magnesium level.  Sweating a lot. SIGNS AND SYMPTOMS  Weakness.  Constipation.  Fatigue.  Muscle cramps.  Mental  confusion.  Skipped heartbeats or irregular heartbeat (palpitations).  Tingling or numbness. DIAGNOSIS  Your health care provider can diagnose hypokalemia with blood tests. In addition to checking your potassium level, your health care provider may also check other lab tests. TREATMENT Hypokalemia can be treated with potassium supplements taken by mouth or adjustments in your current medicines. If your potassium level is very low, you may need to get potassium through a vein (IV) and be monitored in the hospital. A diet high in potassium is also helpful. Foods high in potassium are:  Nuts, such as peanuts and pistachios.  Seeds, such as sunflower seeds and pumpkin seeds.  Peas, lentils, and lima beans.  Whole grain and bran cereals and breads.  Fresh fruit and vegetables, such as apricots, avocado, bananas, cantaloupe, kiwi, oranges, tomatoes, asparagus, and potatoes.  Orange and tomato juices.  Red meats.  Fruit yogurt. HOME CARE INSTRUCTIONS  Take all medicines as prescribed by your health care provider.  Maintain a healthy diet by including nutritious food, such as fruits, vegetables, nuts, whole grains, and lean meats.  If you are taking a laxative, be sure to follow the directions on the label. SEEK MEDICAL CARE IF:  Your weakness gets worse.  You feel your heart pounding or racing.  You are vomiting or having diarrhea.  You are diabetic and having trouble keeping your blood glucose in the normal range. SEEK IMMEDIATE MEDICAL CARE IF:  You have chest pain, shortness of breath, or dizziness.  You are vomiting or having diarrhea for more than 2 days.  You faint. MAKE SURE YOU:   Understand these instructions.  Will watch your condition.  Will get help right away if you are not doing well or get worse.   This information is not intended to replace advice given to you by your health care provider. Make sure you discuss any questions you have with your health  care provider.   Document Released: 03/08/2005 Document Revised: 03/29/2014 Document Reviewed: 09/08/2012 Elsevier Interactive Patient Education [employer].

## 2015-06-18 NOTE — ED Notes (Signed)
Report to Bill, RN

## 2015-06-18 NOTE — ED Provider Notes (Signed)
[facility] Emergency Department Provider Note     Time seen: ----------------------------------------- 7:23 AM on 06/18/2015 -----------------------------------------    I have reviewed the triage vital signs and the nursing notes.   HISTORY  Chief Complaint Chest Pain and Shortness of Breath    HPI Jacqueline Bowman is a 50 y.o. female who presents to ERfor chest pain and shortness of breath. Patient states she had surgery on her right leg ago, has had cramping and leg and she is concerned blood clot may have gone to her lung. She denies fevers, chills, denies chest pain does flow short of breath particularly on exertion. Patient sometimes when she deep breathes she gets a little short of breath. She denies nausea, vomiting or diarrhea.   Past Medical History  Diagnosis Date  . Breast cancer [facility])     January 2001  . Hypertension     There are no active problems to display for this patient.   Past Surgical History  Procedure Laterality Date  . Knee arthroscopy    . Fracture surgery    . Abdominal hysterectomy      Allergies Review of patient's allergies indicates no known allergies.  Social History Social History  Substance Use Topics  . Smoking status: Never Smoker   . Smokeless tobacco: None  . Alcohol Use: Yes     Comment: socially    Review of Systems Constitutional: Negative for fever. Eyes: Negative for visual changes. ENT: Negative for sore throat. Cardiovascular: Negative for chest pain. Respiratory: Positive for shortness of breath Gastrointestinal: Negative for abdominal pain, vomiting and diarrhea. Genitourinary: Negative for dysuria. Musculoskeletal: Positive for right leg pain Skin: Negative for rash. Neurological: Negative for headaches, focal weakness or numbness.  10-point ROS otherwise negative.  ____________________________________________   PHYSICAL EXAM:  VITAL SIGNS: ED Triage Vitals  Enc Vitals  Group     BP 06/18/15 0604 117/88 mmHg     Pulse Rate 06/18/15 0604 84     Resp 06/18/15 0604 18     Temp 06/18/15 0604 97.7 F (36.5 C)     Temp Source 06/18/15 0604 Oral     SpO2 06/18/15 0604 99 %     Weight 06/18/15 0604 187 lb (84.823 kg)     Height 06/18/15 0604 5\' 5"  (1.651 m)     Head Cir --      Peak Flow --      Pain Score 06/18/15 0605 8     Pain Loc --      Pain Edu? --      Excl. in [place]? --     Constitutional: Alert and oriented. Well appearing and in no distress. Eyes: Conjunctivae are normal. PERRL. Normal extraocular movements. ENT   Head: Normocephalic and atraumatic.   Nose: No congestion/rhinnorhea.   Mouth/Throat: Mucous membranes are moist.   Neck: No stridor. Cardiovascular: Normal rate, regular rhythm. Normal and symmetric distal pulses are present in all extremities. No murmurs, rubs, or gallops. Respiratory: Normal respiratory effort without tachypnea nor retractions. Breath sounds are clear and equal bilaterally. No wheezes/rales/rhonchi. Gastrointestinal: Soft and nontender. No distention. No abdominal bruits.  Musculoskeletal: Nontender with normal range of motion in all extremities. No joint effusions.  No lower extremity tenderness nor edema. Neurologic:  Normal speech and language. No gross focal neurologic deficits are appreciated.  Skin:  Skin is warm, dry and intact. No rash noted. Psychiatric: Mood and affect are normal. Speech and behavior are normal. Patient exhibits appropriate insight and judgment.  ____________________________________________  EKG: Interpreted by me. Normal sinus rhythm rate 82 bpm, normal PR interval, normal QRS, normal QT interval. Normal axis.  ____________________________________________  ED COURSE:  Pertinent labs & imaging results that were available during my care of the patient were reviewed by me and considered in my medical decision making (see chart for details). Patient looks well, will check basic  labs and reevaluate. ____________________________________________    LABS (pertinent positives/negatives)  Labs Reviewed  BASIC METABOLIC PANEL - Abnormal; Notable for the following:    Potassium 2.4 (*)    Chloride 96 (*)    Glucose, Bld 100 (*)    All other components within normal limits  CBC - Abnormal; Notable for the following:    MCV 75.9 (*)    MCH 24.9 (*)    RDW 15.4 (*)    All other components within normal limits  URINALYSIS COMPLETEWITH MICROSCOPIC ([facility] ONLY) - Abnormal; Notable for the following:    Color, Urine YELLOW (*)    APPearance CLEAR (*)    Leukocytes, UA TRACE (*)    Bacteria, UA RARE (*)    Squamous Epithelial / LPF 0-5 (*)    All other components within normal limits  TROPONIN I  BRAIN NATRIURETIC PEPTIDE    RADIOLOGY  Chest x-ray is unremarkable CT angiogram of the chest IMPRESSION: Negative for pulmonary embolus. No acute abnormality or finding to explain the patient's symptoms.  Hypertrophy of the left lobe of the thyroid is new since the prior CT scan. No focal thyroid lesion is identified.  ____________________________________________  FINAL ASSESSMENT AND PLAN  Dyspnea, hypokalemia  Plan: Patient with labs and imaging as dictated above. Patient with chronic hypokalemia, she was given 60 mg K-Dur here. She'll be started on potassium is started taken daily at home. CT scan is unremarkable, she stable for outpatient follow-up with her doctor   Earleen Newport, MD   Earleen Newport, MD 06/18/15 [phone]

## 2015-06-18 NOTE — ED Notes (Signed)
Pt ambulatory back from xray.

## 2015-07-01 ENCOUNTER — Telehealth: Payer: Self-pay | Admitting: Emergency Medicine

## 2015-07-01 NOTE — ED Notes (Signed)
Pt called asking for prescription for potassium to be called in as she lost it.  i called it to [employer] and she agrees to call Juanda Crumble drew (dr patel) to do a  Follow up viist.

## 2016-03-22 HISTORY — PX: FRACTURE SURGERY: SHX138

## 2016-03-22 HISTORY — PX: TOTAL KNEE ARTHROPLASTY: SHX125

## 2016-07-11 ENCOUNTER — Encounter: Payer: Self-pay | Admitting: Emergency Medicine

## 2016-07-11 ENCOUNTER — Emergency Department: Payer: Self-pay

## 2016-07-11 ENCOUNTER — Emergency Department
Admission: EM | Admit: 2016-07-11 | Discharge: 2016-07-11 | Disposition: A | Discharge status: Home / Self Care | Payer: Self-pay | Attending: Emergency Medicine | Admitting: Emergency Medicine

## 2016-07-11 DIAGNOSIS — I10 Essential (primary) hypertension: Secondary | ICD-10-CM | POA: Insufficient documentation

## 2016-07-11 DIAGNOSIS — Z79899 Other long term (current) drug therapy: Secondary | ICD-10-CM | POA: Insufficient documentation

## 2016-07-11 DIAGNOSIS — M1712 Unilateral primary osteoarthritis, left knee: Secondary | ICD-10-CM | POA: Insufficient documentation

## 2016-07-11 DIAGNOSIS — Z853 Personal history of malignant neoplasm of breast: Secondary | ICD-10-CM | POA: Insufficient documentation

## 2016-07-11 DIAGNOSIS — M19171 Post-traumatic osteoarthritis, right ankle and foot: Secondary | ICD-10-CM | POA: Insufficient documentation

## 2016-07-11 MED ORDER — MELOXICAM 7.5 MG PO TABS: 7.5000 mg | ORAL_TABLET | Freq: Every day | ORAL | 0 refills | Status: DC

## 2016-07-11 MED ORDER — BUPIVACAINE HCL (PF) 0.5 % IJ SOLN
5.0000 mL | Freq: Once | INTRAMUSCULAR | Status: AC
  Administered 2016-07-11: 5 mL
  Filled 2016-07-11: qty 30

## 2016-07-11 MED ORDER — TRIAMCINOLONE ACETONIDE 40 MG/ML IJ SUSP
40.0000 mg | Freq: Once | INTRAMUSCULAR | Status: AC
  Administered 2016-07-11: 40 mg via INTRA_ARTICULAR
  Filled 2016-07-11: qty 1

## 2016-07-11 NOTE — Discharge Instructions (Signed)
Please follow-up with your PCP or orthopedic provider in 3-4 days. Rest ice and elevate the foot and knee. He uses a walker as needed for ambulation. Take meloxicam 7.5 mg daily with food. Return to the ER for any worsening symptoms urgent changes in her health.

## 2016-07-11 NOTE — ED Notes (Signed)
Pt report shaving had surgery on right foot and knee 1 year ago. Pt reports chronic pain since surgery but increased pain in right foot today with swelling and bruising noted. Decreased movement noted. Pulse intact and strong at this time. Sensation intact.  Acute pain, swelling and warmth noted in right knee with increased pain upon palpation and ambulation.

## 2016-07-11 NOTE — ED Provider Notes (Signed)
[facility] Provider Note   CSN: [id number] Arrival date & time: 07/11/16  1242     History   Chief Complaint Chief Complaint  Patient presents with  . Foot Pain  . Knee Pain    HPI Jacqueline Bowman is a 51 y.o. female presents to the emergency department for evaluation of right foot pain, left knee pain. She denies any trauma or injury. Pain is been present for 2 years, is on chronic pain medication, oxycodone 10 mg every 6 hours as needed for pain. Patient states over the last week her pain is been more severe in the right foot and the left knee. She has known osteoarthritis Route the left knee, has been told she needs knee replacement. She has had a successful right total knee replacement. Patient has a history of a right calcaneal fracture with open reduction and internal fixation. Patient's pain in the right foot is located along the top of her foot. She's had intermittent swelling, no redness or warmth. Her left knee pain is located along the medial joint line she describes it as a 8 is increased weightbearing activity. No trauma warmth or erythema present. She is not taking any anti-inflammatory medications for pain.  HPI  Past Medical History:  Diagnosis Date  . Breast cancer [facility])    January 2001  . Hypertension     There are no active problems to display for this patient.   Past Surgical History:  Procedure Laterality Date  . ABDOMINAL HYSTERECTOMY    . FRACTURE SURGERY    . KNEE ARTHROSCOPY      OB History    No data available       Home Medications    Prior to Admission medications   Medication Sig Start Date End Date Taking? Authorizing Provider  ibuprofen (ADVIL,MOTRIN) 800 MG tablet Take 1 tablet (800 mg total) by mouth every 8 (eight) hours as needed. 02/02/15   Earleen Newport, MD  meloxicam (MOBIC) 7.5 MG tablet Take 1 tablet (7.5 mg total) by mouth daily. 07/11/16 07/11/17  Duanne Guess, PA-C  ondansetron (ZOFRAN ODT) 4 MG  disintegrating tablet Take 1 tablet (4 mg total) by mouth every 8 (eight) hours as needed for nausea or vomiting. 06/01/15   Carrie Mew, MD  oxyCODONE-acetaminophen (PERCOCET) 7.5-325 MG tablet Take 1 tablet by mouth every 4 (four) hours as needed for severe pain. 02/14/15   Sable Feil, PA-C  potassium chloride (K-DUR) 10 MEQ tablet Take 1 tablet (10 mEq total) by mouth daily. 06/18/15   Earleen Newport, MD  sulfamethoxazole-trimethoprim (BACTRIM DS) 800-160 MG tablet Take 1 tablet by mouth 2 (two) times daily. 06/01/15   Carrie Mew, MD    Family History No family history on file.  Social History Social History  Substance Use Topics  . Smoking status: Never Smoker  . Smokeless tobacco: Never Used  . Alcohol use Yes     Comment: socially     Allergies   Patient has no known allergies.   Review of Systems Review of Systems  Constitutional: Negative for activity change, chills, fatigue and fever.  HENT: Negative for congestion, sinus pressure and sore throat.   Eyes: Negative for visual disturbance.  Respiratory: Negative for cough, chest tightness and shortness of breath.   Cardiovascular: Negative for chest pain and leg swelling.  Gastrointestinal: Negative for abdominal pain, diarrhea, nausea and vomiting.  Genitourinary: Negative for dysuria.  Musculoskeletal: Positive for arthralgias and gait problem.  Skin: Negative for  rash and wound.  Neurological: Negative for weakness, numbness and headaches.  Hematological: Negative for adenopathy.  Psychiatric/Behavioral: Negative for agitation, behavioral problems and confusion.     Physical Exam Updated Vital Signs BP 113/67 (BP Location: Left Arm)   Pulse 96   Temp 98.4 F (36.9 C) (Oral)   Resp 18   Ht 5\' 5"  (1.651 m)   Wt 93 kg   SpO2 100%   BMI 34.11 kg/m   Physical Exam  Constitutional: She is oriented to person, place, and time. She appears well-developed and well-nourished. No distress.  HENT:    Head: Normocephalic and atraumatic.  Mouth/Throat: Oropharynx is clear and moist.  Eyes: EOM are normal. Pupils are equal, round, and reactive to light. Right eye exhibits no discharge. Left eye exhibits no discharge.  Neck: Normal range of motion. Neck supple.  Cardiovascular: Normal rate, regular rhythm and intact distal pulses.   Pulmonary/Chest: Effort normal. No respiratory distress.  Musculoskeletal:  Examination of the right foot and ankle shows patient has a healed incision. There is no warmth erythema or drainage. She is tender along the dorsum of the right foot. There is no swelling or warmth. No ecchymosis. Patient has full active range of motion of the left knee with flexion and extension. She is able to straight leg raise. She is tender along the medial joint line. There is no warmth erythema or effusion. Knee is stable to valgus and varus stress testing. No swelling or edema throughout bilateral lower extremity is.  Neurological: She is alert and oriented to person, place, and time. She has normal reflexes.  Skin: Skin is warm and dry. No erythema.  Psychiatric: She has a normal mood and affect. Her behavior is normal. Thought content normal.     ED Treatments / Results  Labs (all labs ordered are listed, but only abnormal results are displayed) Labs Reviewed - No data to display  EKG  EKG Interpretation None       Radiology Dg Knee Complete 4 Views Left  Result Date: 07/11/2016 CLINICAL DATA:  Patient with generalized left knee pain. EXAM: LEFT KNEE - COMPLETE 4+ VIEW COMPARISON:  None. FINDINGS: Normal anatomic alignment. No evidence for acute fracture or dislocation. Mild tricompartmental osteophytosis. Regional soft tissues unremarkable. No joint effusion. IMPRESSION: Mild degenerative changes.  No acute osseous abnormality. Electronically Signed   By: Lovey Newcomer M.D.   On: 07/11/2016 15:12   Dg Foot Complete Right  Result Date: 07/11/2016 CLINICAL DATA:   51 year old female with a history of right foot pain. Pain since prior surgery which is worst today EXAM: RIGHT FOOT COMPLETE - 3+ VIEW COMPARISON:  07/11/2014 FINDINGS: Re- demonstration of postsurgical changes of cannulated screw fixation at prior calcaneal osteotomy, with interval healing at the surgery site. No fracture at the calcaneus or at the screw. No displaced fracture of the right foot. Osteopenia. Degenerative changes of the forefoot, midfoot, and hindfoot. No focal soft tissue swelling.  No radiopaque foreign body. IMPRESSION: No acute bony abnormality identified. Re- demonstration of surgical changes of the calcaneus, with healed osteotomy and no complicating features of the cannulated screw fixation. Degenerative changes of the forefoot, midfoot, and hindfoot. Electronically Signed   By: Corrie Mckusick D.O.   On: 07/11/2016 15:11    Procedures Procedures (including critical care time) Gilford Rile provided for the patient today in the emergency department.  Left knee intra-articular cortisone injection: Lateral joint line of the left knee was prepped with chlorhexidine, alcohol and Betadine. Patient was  injected with a 25-gauge 1-1/2 inch needle with a bolus solution of 4 cc of 0.5% bupivacaine and 1 cc of Kenalog 40 mg. Patient tolerated procedure well, Band-Aid was applied.  Medications Ordered in ED Medications  triamcinolone acetonide (KENALOG-40) injection 40 mg (not administered)  bupivacaine (MARCAINE) 0.5 % injection 5 mL (not administered)     Initial Impression / Assessment and Plan / ED Course  I have reviewed the triage vital signs and the nursing notes.  Pertinent labs & imaging results that were available during my care of the patient were reviewed by me and considered in my medical decision making (see chart for details).     51 year old female with acute on chronic right foot and left knee pain. Patient has arthritis throughout the foot and the left knee. She is given a  walker to help with ambulation. She will start meloxicam 7.5 mg daily with food. She agreed and consented to a left knee intra-articular cortisone injection today in the office. Patient tolerated the shot well. She is educated on signs and symptoms to return to the ED for.  Final Clinical Impressions(s) / ED Diagnoses   Final diagnoses:  Post-traumatic osteoarthritis of right foot  Primary osteoarthritis of left knee    New Prescriptions New Prescriptions   MELOXICAM (MOBIC) 7.5 MG TABLET    Take 1 tablet (7.5 mg total) by mouth daily.     Duanne Guess, PA-C 07/11/16 [employer], MD 07/12/16 [phone]

## 2016-07-11 NOTE — ED Triage Notes (Signed)
Pt comes into the Ed via POV c/o right foot pain and left knee pain.  Patient concerned about possibility of gout.  Inflammation present in both joints. Patient in NAD at this time and able to ambulate into the triage room.

## 2016-11-22 ENCOUNTER — Emergency Department
Admission: EM | Admit: 2016-11-22 | Discharge: 2016-11-22 | Disposition: A | Discharge status: Home / Self Care | Payer: Private Health Insurance - Indemnity | Attending: Emergency Medicine | Admitting: Emergency Medicine

## 2016-11-22 ENCOUNTER — Encounter: Payer: Self-pay | Admitting: Emergency Medicine

## 2016-11-22 DIAGNOSIS — M25562 Pain in left knee: Secondary | ICD-10-CM | POA: Insufficient documentation

## 2016-11-22 DIAGNOSIS — G8929 Other chronic pain: Secondary | ICD-10-CM | POA: Insufficient documentation

## 2016-11-22 DIAGNOSIS — I10 Essential (primary) hypertension: Secondary | ICD-10-CM | POA: Insufficient documentation

## 2016-11-22 LAB — CBC WITH DIFFERENTIAL/PLATELET
BASOS ABS: 0.1 10*3/uL (ref 0–0.1)
Basophils Relative: 2 %
Eosinophils Absolute: 0.1 10*3/uL (ref 0–0.7)
Eosinophils Relative: 3 %
HEMATOCRIT: 36.2 % (ref 35.0–47.0)
Hemoglobin: 11.9 g/dL — ABNORMAL LOW (ref 12.0–16.0)
LYMPHS PCT: 44 %
Lymphs Abs: 2.2 10*3/uL (ref 1.0–3.6)
MCH: 25.5 pg — ABNORMAL LOW (ref 26.0–34.0)
MCHC: 32.9 g/dL (ref 32.0–36.0)
MCV: 77.6 fL — AB (ref 80.0–100.0)
Monocytes Absolute: 0.4 10*3/uL (ref 0.2–0.9)
Monocytes Relative: 8 %
NEUTROS ABS: 2.1 10*3/uL (ref 1.4–6.5)
Neutrophils Relative %: 43 %
Platelets: 145 10*3/uL — ABNORMAL LOW (ref 150–440)
RBC: 4.67 MIL/uL (ref 3.80–5.20)
RDW: 16.3 % — ABNORMAL HIGH (ref 11.5–14.5)
WBC: 4.8 10*3/uL (ref 3.6–11.0)

## 2016-11-22 LAB — COMPREHENSIVE METABOLIC PANEL
ALBUMIN: 4 g/dL (ref 3.5–5.0)
ALT: 19 U/L (ref 14–54)
ANION GAP: 6 (ref 5–15)
AST: 21 U/L (ref 15–41)
Alkaline Phosphatase: 95 U/L (ref 38–126)
BILIRUBIN TOTAL: 0.4 mg/dL (ref 0.3–1.2)
BUN: 13 mg/dL (ref 6–20)
CALCIUM: 8.8 mg/dL — AB (ref 8.9–10.3)
CO2: 29 mmol/L (ref 22–32)
Chloride: 106 mmol/L (ref 101–111)
Creatinine, Ser: 0.68 mg/dL (ref 0.44–1.00)
GLUCOSE: 87 mg/dL (ref 65–99)
POTASSIUM: 3.6 mmol/L (ref 3.5–5.1)
Sodium: 141 mmol/L (ref 135–145)
TOTAL PROTEIN: 7.4 g/dL (ref 6.5–8.1)

## 2016-11-22 MED ORDER — PREDNISONE 10 MG PO TABS: ORAL_TABLET | ORAL | 0 refills | Status: DC

## 2016-11-22 MED ORDER — KETOROLAC TROMETHAMINE 30 MG/ML IJ SOLN
30.0000 mg | Freq: Once | INTRAMUSCULAR | Status: AC
  Administered 2016-11-22: 30 mg via INTRAVENOUS
  Filled 2016-11-22: qty 1

## 2016-11-22 NOTE — ED Triage Notes (Signed)
Patient presents to the ED with chronic left knee pain.  Patient states she is hoping to get a cortisone shot in her knee today.  Patient states, "I've been waiting for my charity care to kick in, but I can't take the pain any longer."  Patient is also complaining of right foot pain at this time.

## 2016-11-22 NOTE — ED Notes (Addendum)
See triage note. states she is having pain to left knee and right ankle ankle  States she is waiting for charity care so she can have surgery  Pt is currently taking oxy for pain but it isn't helping   Left knee is swollen and warm to touch

## 2016-11-22 NOTE — ED Provider Notes (Signed)
[facility] Emergency Department Provider Note  ____________________________________________   First MD Initiated Contact with Patient 11/22/16 1359     (approximate)  I have reviewed the triage vital signs and the nursing notes.   HISTORY  Chief Complaint Knee Pain   HPI Jacqueline Bowman is a 51 y.o. female presents to the emergency department with chronic left knee pain. Patient denies any recent injury to her knee. Patient states that she is here to get a cortisone shot in her knee, stating that she got one in April and wants another one. Patient currently is waiting for her charity care to take effect so that she can have her knee care at [facility]. She states that they have discussed a total knee replacement. She is also hoping for a cortisone shot in her right foot for the pain there as well. She denies any recent injury. She continues to take her Percocet for her chronic pain. She states that she took one this morning without relief of her pain. She rates her pain as a 9/10.   Past Medical History:  Diagnosis Date  . Breast cancer [facility])    January 2001  . Hypertension     There are no active problems to display for this patient.   Past Surgical History:  Procedure Laterality Date  . ABDOMINAL HYSTERECTOMY    . FRACTURE SURGERY    . KNEE ARTHROSCOPY      Prior to Admission medications   Medication Sig Start Date End Date Taking? Authorizing Provider  predniSONE (DELTASONE) 10 MG tablet Take 6 tablets  today, on day 2 take 5 tablets, day 3 take 4 tablets, day 4 take 3 tablets, day 5 take  2 tablets and 1 tablet the last day 11/22/16   Johnn Hai, PA-C    Allergies Patient has no known allergies.  No family history on file.  Social History Social History  Substance Use Topics  . Smoking status: Never Smoker  . Smokeless tobacco: Never Used  . Alcohol use Yes     Comment: socially    Review of Systems Constitutional: No  fever/chills Cardiovascular: Denies chest pain. Respiratory: Denies shortness of breath. Musculoskeletal: Positive for chronic left knee pain. Positive for right foot pain. Skin: Negative for rash. Neurological: Negative for headaches, focal weakness or numbness. ____________________________________________   PHYSICAL EXAM:  VITAL SIGNS: ED Triage Vitals  Enc Vitals Group     BP 11/22/16 1219 113/67     Pulse Rate 11/22/16 1219 86     Resp 11/22/16 1219 18     Temp 11/22/16 1219 98.4 F (36.9 C)     Temp Source 11/22/16 1219 Oral     SpO2 11/22/16 1219 97 %     Weight 11/22/16 1214 205 lb (93 kg)     Height 11/22/16 1214 5\' 5"  (1.651 m)     Head Circumference --      Peak Flow --      Pain Score 11/22/16 1213 9     Pain Loc --      Pain Edu? --      Excl. in [place]? --    Constitutional: Alert and oriented. Well appearing and in no acute distress. Eyes: Conjunctivae are normal.  Head: Atraumatic. Nose: No congestion/rhinnorhea. Neck: No stridor.   Cardiovascular: Normal rate, regular rhythm. Grossly normal heart sounds.  Good peripheral circulation. Respiratory: Normal respiratory effort.  No retractions. Lungs CTAB. Musculoskeletal: Examination of left knee there is obvious chronic  degenerative changes. Patient is able to flex and extend her knee but is guarded secondary to discomfort. No crepitus was appreciated. No effusion noted. There is no erythema noted but the medial aspect of her knee felt warm. This was questionable since she did have a blanket over her legs. Patient states that she has chronic "heat" to her left knee. Skin is intact and no evidence of infection is present. No deformity or injury is noted to the right foot and no soft tissue swelling is present. Neurologic:  Normal speech and language. No gross focal neurologic deficits are appreciated.  Skin:  Skin is warm, dry and intact. No rash noted. Psychiatric: Mood and affect are normal. Speech and behavior are  normal.  ____________________________________________   LABS (all labs ordered are listed, but only abnormal results are displayed)  Labs Reviewed  COMPREHENSIVE METABOLIC PANEL - Abnormal; Notable for the following:       Result Value   Calcium 8.8 (*)    All other components within normal limits  CBC WITH DIFFERENTIAL/PLATELET - Abnormal; Notable for the following:    Hemoglobin 11.9 (*)    MCV 77.6 (*)    MCH 25.5 (*)    RDW 16.3 (*)    Platelets 145 (*)    All other components within normal limits   PROCEDURES  Procedure(s) performed: None  Procedures  Critical Care performed: No  ____________________________________________   INITIAL IMPRESSION / ASSESSMENT AND PLAN / ED COURSE  Pertinent labs & imaging results that were available during my care of the patient were reviewed by me and considered in my medical decision making (see chart for details).  White count was discussed with patient was reassuring that there is no infection. Patient was given a prescription for prednisone Dosepak until she can see her orthopedist in [place]. She states that the Toradol she was given IV did not help with her pain. Patient does have a prescription for Percocet at home that she will continue taking until she is able to see her PCP or orthopedist.   ___________________________________________   FINAL CLINICAL IMPRESSION(S) / ED DIAGNOSES  Final diagnoses:  Chronic pain of left knee      NEW MEDICATIONS STARTED DURING THIS VISIT:  Discharge Medication List as of 11/22/2016  3:13 PM    START taking these medications   Details  predniSONE (DELTASONE) 10 MG tablet Take 6 tablets  today, on day 2 take 5 tablets, day 3 take 4 tablets, day 4 take 3 tablets, day 5 take  2 tablets and 1 tablet the last day, Print         Note:  This document was prepared using Dragon voice recognition software and may include unintentional dictation errors.    Johnn Hai,  PA-C 11/22/16 1610    Lisa Roca, MD 11/27/16 1102

## 2016-11-22 NOTE — Discharge Instructions (Signed)
Continue taking her Percocet as directed by your doctor. You may begin taking prednisone taper today beginning with 6 tablets and taper down to 1. You need to follow up with your primary care doctor at [facility] if any continued problems and also make an appointment with your orthopedist at [facility].

## 2017-01-26 DIAGNOSIS — M1990 Unspecified osteoarthritis, unspecified site: Secondary | ICD-10-CM | POA: Insufficient documentation

## 2017-01-29 DIAGNOSIS — I10 Essential (primary) hypertension: Secondary | ICD-10-CM | POA: Insufficient documentation

## 2017-02-23 ENCOUNTER — Ambulatory Visit: Payer: Self-pay | Attending: Oncology

## 2017-02-23 ENCOUNTER — Encounter (INDEPENDENT_AMBULATORY_CARE_PROVIDER_SITE_OTHER): Payer: Self-pay

## 2017-02-23 VITALS — BP 105/69 | HR 86 | Temp 97.0°F | Resp 18 | Ht 67.32 in | Wt 197.0 lb

## 2017-02-23 DIAGNOSIS — N63 Unspecified lump in unspecified breast: Secondary | ICD-10-CM

## 2017-02-23 NOTE — Progress Notes (Signed)
Subjective:     Patient ID: Jacqueline Bowman, female   DOB: [birth year], 51 y.o.   MRN: [id number]  HPI   Review of Systems     Objective:   Physical Exam  Pulmonary/Chest: Right breast exhibits mass. Right breast exhibits no nipple discharge, no skin change and no tenderness. Left breast exhibits mass. Left breast exhibits no nipple discharge, no skin change and no tenderness. Breasts are asymmetrical.     scarring from bilateral mastectomy, tram flap reconstruction. No nipple tattooing; thickening bilateral       Assessment:     51 year old patient presents for [facility] [place] clinic visit. 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  BRCA positive. Had bilateral mastectomies with TRAM flap reconstruction with implants at [facility].  Patient screened, and meets BCCCP eligibility.  Patient does not have insurance, Medicare or Medicaid.  Handout given on Affordable Care Act.   Instructed patient on breast self-exam using teach back method.  Patient is concerned about bilateral breast breast thickenings.  Palpated thickening left breast at 11 o'clock left breast.  Patient also has concern about area at 10 o'clock right breast.  Unable to palpate mass, but able to visualize skin change in area of concern.    Plan:     Jeanella Anton to schedule patient for bilateral diagnostic mammogram, with ultrasound.

## 2017-03-04 ENCOUNTER — Ambulatory Visit
Admission: RE | Admit: 2017-03-04 | Discharge: 2017-03-04 | Disposition: A | Discharge status: Home / Self Care | Payer: Self-pay | Source: Ambulatory Visit | Attending: Oncology | Admitting: Oncology

## 2017-03-04 ENCOUNTER — Other Ambulatory Visit: Payer: Self-pay

## 2017-03-04 DIAGNOSIS — N63 Unspecified lump in unspecified breast: Secondary | ICD-10-CM

## 2017-03-04 DIAGNOSIS — R928 Other abnormal and inconclusive findings on diagnostic imaging of breast: Secondary | ICD-10-CM | POA: Insufficient documentation

## 2017-03-07 ENCOUNTER — Other Ambulatory Visit: Payer: Self-pay

## 2017-03-07 ENCOUNTER — Encounter: Payer: Self-pay | Admitting: Emergency Medicine

## 2017-03-07 ENCOUNTER — Emergency Department: Payer: Self-pay

## 2017-03-07 ENCOUNTER — Emergency Department
Admission: EM | Admit: 2017-03-07 | Discharge: 2017-03-07 | Disposition: A | Discharge status: Home / Self Care | Payer: Self-pay | Attending: Emergency Medicine | Admitting: Emergency Medicine

## 2017-03-07 DIAGNOSIS — M25562 Pain in left knee: Secondary | ICD-10-CM | POA: Insufficient documentation

## 2017-03-07 LAB — CBC WITH DIFFERENTIAL/PLATELET
Basophils Absolute: 0.1 10*3/uL (ref 0–0.1)
Basophils Relative: 1 %
Eosinophils Absolute: 0.3 10*3/uL (ref 0–0.7)
Eosinophils Relative: 5 %
HCT: 37 % (ref 35.0–47.0)
Hemoglobin: 11.7 g/dL — ABNORMAL LOW (ref 12.0–16.0)
Lymphocytes Relative: 40 %
Lymphs Abs: 2 10*3/uL (ref 1.0–3.6)
MCH: 25 pg — ABNORMAL LOW (ref 26.0–34.0)
MCHC: 31.7 g/dL — ABNORMAL LOW (ref 32.0–36.0)
MCV: 78.9 fL — ABNORMAL LOW (ref 80.0–100.0)
Monocytes Absolute: 0.4 10*3/uL (ref 0.2–0.9)
Monocytes Relative: 7 %
Neutro Abs: 2.4 10*3/uL (ref 1.4–6.5)
Neutrophils Relative %: 47 %
Platelets: 143 10*3/uL — ABNORMAL LOW (ref 150–440)
RBC: 4.69 MIL/uL (ref 3.80–5.20)
RDW: 16.8 % — ABNORMAL HIGH (ref 11.5–14.5)
WBC: 5 10*3/uL (ref 3.6–11.0)

## 2017-03-07 LAB — COMPREHENSIVE METABOLIC PANEL
ALT: 13 U/L — ABNORMAL LOW (ref 14–54)
AST: 16 U/L (ref 15–41)
Albumin: 4.1 g/dL (ref 3.5–5.0)
Alkaline Phosphatase: 98 U/L (ref 38–126)
Anion gap: 7 (ref 5–15)
BUN: 11 mg/dL (ref 6–20)
CO2: 28 mmol/L (ref 22–32)
Calcium: 9.6 mg/dL (ref 8.9–10.3)
Chloride: 103 mmol/L (ref 101–111)
Creatinine, Ser: 0.65 mg/dL (ref 0.44–1.00)
GFR calc Af Amer: >60 mL/min (ref >60)
GFR calc non Af Amer: >60 mL/min (ref >60)
Glucose, Bld: 105 mg/dL — ABNORMAL HIGH (ref 65–99)
Potassium: 4.3 mmol/L (ref 3.5–5.1)
Sodium: 138 mmol/L (ref 135–145)
Total Bilirubin: 0.5 mg/dL (ref 0.3–1.2)
Total Protein: 7.8 g/dL (ref 6.5–8.1)

## 2017-03-07 NOTE — ED Provider Notes (Signed)
[facility] Emergency Department Provider Note  ____________________________________________  Time seen: Approximately 6:38 PM  I have reviewed the triage vital signs and the nursing notes.   HISTORY  Chief Complaint Knee Pain    HPI Jacqueline Bowman is a 51 y.o. female presents to the emergency department with 10 out of 10 left knee pain.  Patient reports increased warmth.  Patient states that she had a total knee arthroplasty approximately 1 month ago performed by Dr. Rozelle Logan.  Patient reports that she has been recovering well until now.  She denies fever or chills.  No instances of trauma.  Patient denies daily smoking, recent travel, prolonged immobilization or history of DVT.  No alleviating measures of been attempted.   Past Medical History:  Diagnosis Date  . Breast cancer [facility])    January 2001  . Hypertension     There are no active problems to display for this patient.   Past Surgical History:  Procedure Laterality Date  . ABDOMINAL HYSTERECTOMY    . FRACTURE SURGERY    . KNEE ARTHROSCOPY      Prior to Admission medications   Medication Sig Start Date End Date Taking? Authorizing Provider  predniSONE (DELTASONE) 10 MG tablet Take 6 tablets  today, on day 2 take 5 tablets, day 3 take 4 tablets, day 4 take 3 tablets, day 5 take  2 tablets and 1 tablet the last day 11/22/16   Johnn Hai, PA-C    Allergies Patient has no known allergies.  No family history on file.  Social History Social History   Tobacco Use  . Smoking status: Never Smoker  . Smokeless tobacco: Never Used  Substance Use Topics  . Alcohol use: Yes    Comment: socially  . Drug use: No     Review of Systems  Constitutional: No fever/chills Eyes: No visual changes. No discharge ENT: No upper respiratory complaints. Cardiovascular: no chest pain. Respiratory: no cough. No SOB. Musculoskeletal: Patient has left knee pain.  Skin: Negative for rash,  abrasions, lacerations, ecchymosis. Neurological: Negative for headaches, focal weakness or numbness.   ___________________________________   PHYSICAL EXAM:  VITAL SIGNS: ED Triage Vitals  Enc Vitals Group     BP 03/07/17 1732 132/87     Pulse Rate 03/07/17 1732 (!) 102     Resp 03/07/17 1732 16     Temp 03/07/17 1732 98.2 F (36.8 C)     Temp Source 03/07/17 1732 Oral     SpO2 03/07/17 1732 100 %     Weight 03/07/17 1732 196 lb (88.9 kg)     Height 03/07/17 1732 5\' 5"  (1.651 m)     Head Circumference --      Peak Flow --      Pain Score 03/07/17 1740 5     Pain Loc --      Pain Edu? --      Excl. in [place]? --      Constitutional: Alert and oriented. Well appearing and in no acute distress. Eyes: Conjunctivae are normal. PERRL. EOMI. Head: Atraumatic. Cardiovascular: Normal rate, regular rhythm. Normal S1 and S2.  Good peripheral circulation. Respiratory: Normal respiratory effort without tachypnea or retractions. Lungs CTAB. Good air entry to the bases with no decreased or absent breath sounds. Musculoskeletal: Patient is able to perform flexion and extension at the knee actively with mild pain.  No popliteal fullness.  Loss of peripatellar dimpling visualized.  Pain is elicited with palpation over the medial  joint line.  No laxity was elicited with collateral ligament testing.  Palpable dorsalis pedis pulse, left. Neurologic:  Normal speech and language. No gross focal neurologic deficits are appreciated.  Skin:  Skin is warm, dry and intact. No rash noted. Psychiatric: Mood and affect are normal. Speech and behavior are normal. Patient exhibits appropriate insight and judgement.   ____________________________________________   LABS (all labs ordered are listed, but only abnormal results are displayed)  Labs Reviewed  CBC WITH DIFFERENTIAL/PLATELET - Abnormal; Notable for the following components:      Result Value   Hemoglobin 11.7 (*)    MCV 78.9 (*)    MCH 25.0  (*)    MCHC 31.7 (*)    RDW 16.8 (*)    Platelets 143 (*)    All other components within normal limits  COMPREHENSIVE METABOLIC PANEL - Abnormal; Notable for the following components:   Glucose, Bld 105 (*)    ALT 13 (*)    All other components within normal limits   ____________________________________________  EKG   ____________________________________________  RADIOLOGY Unk Pinto, personally viewed and evaluated these images (plain radiographs) as part of my medical decision making, as well as reviewing the written report by the radiologist.    US Venous Img Lower Unilateral Left  Result Date: 03/07/2017 CLINICAL DATA:  Left knee pain. History breast cancer. Left knee replacement 01/27/2017 EXAM: LOWER EXTREMITY VENOUS DOPPLER ULTRASOUND TECHNIQUE: Gray-scale sonography with graded compression, as well as color Doppler and duplex ultrasound were performed to evaluate the lower extremity deep venous systems from the level of the common femoral vein and including the common femoral, femoral, profunda femoral, popliteal and calf veins including the posterior tibial, peroneal and gastrocnemius veins when visible. The superficial great saphenous vein was also interrogated. Spectral Doppler was utilized to evaluate flow at rest and with distal augmentation maneuvers in the common femoral, femoral and popliteal veins. COMPARISON:  None. FINDINGS: Contralateral Common Femoral Vein: Respiratory phasicity is normal and symmetric with the symptomatic side. No evidence of thrombus. Normal compressibility. Common Femoral Vein: No evidence of thrombus. Normal compressibility, respiratory phasicity and response to augmentation. Saphenofemoral Junction: No evidence of thrombus. Normal compressibility and flow on color Doppler imaging. Profunda Femoral Vein: No evidence of thrombus. Normal compressibility and flow on color Doppler imaging. Femoral Vein: No evidence of thrombus. Normal  compressibility, respiratory phasicity and response to augmentation. Popliteal Vein: No evidence of thrombus. Normal compressibility, respiratory phasicity and response to augmentation. Calf Veins: No evidence of thrombus. Normal compressibility and flow on color Doppler imaging. Superficial Great Saphenous Vein: No evidence of thrombus. Normal compressibility. Venous Reflux:  None. Other Findings: Fluid collection in the popliteal fossa measuring 5.1 x 2.7 x 4.3 cm. IMPRESSION: No evidence of deep venous thrombosis. Bakers cyst Electronically Signed   By: Franchot Gallo M.D.   On: 03/07/2017 18:59   Dg Knee Complete 4 Views Left  Result Date: 03/07/2017 CLINICAL DATA:  Pain EXAM: LEFT KNEE - COMPLETE 4+ VIEW COMPARISON:  July 11, 2016 FINDINGS: Frontal, lateral, and bilateral oblique views were obtained. Patient is status post total knee replacement with femoral and tibial prosthetic components well-seated. No fracture or dislocation evident. There is a moderate joint effusion. No soft tissue air or erosion. IMPRESSION: Moderate joint effusion. No soft tissue air. Femoral and tibial prosthetic components well-seated. No acute fracture or dislocation. Electronically Signed   By: Lowella Grip III M.D.   On: 03/07/2017 19:11    ____________________________________________  PROCEDURES  Procedure(s) performed:    Procedures    Medications - No data to display   ____________________________________________   INITIAL IMPRESSION / ASSESSMENT AND PLAN / ED COURSE  Pertinent labs & imaging results that were available during my care of the patient were reviewed by me and considered in my medical decision making (see chart for details).  Review of the [place] CSRS was performed in accordance of the [place] prior to dispensing any controlled drugs.     Assessment and plan Left knee pain Patient presents to the emergency department with left knee pain.  Differential diagnosis included  disruption of total knee hardware versus DVT versus inflammation secondary to surgery versus septic joint.  History and physical exam findings are consistent with inflammation secondary to surgery.  Ultrasound conducted in the emergency department was noncontributory for thromboembolism.  X-ray examination revealed no disruption of hardware.  Patient was advised to follow-up with her orthopedist as soon as possible.  All patient questions were answered.    ____________________________________________  FINAL CLINICAL IMPRESSION(S) / ED DIAGNOSES  Final diagnoses:  Acute pain of left knee      NEW MEDICATIONS STARTED DURING THIS VISIT:  ED Discharge Orders    None          This chart was dictated using voice recognition software/Dragon. Despite best efforts to proofread, errors can occur which can change the meaning. Any change was purely unintentional.    Lannie Fields, PA-C 03/07/17 2044    Arta Silence, MD 03/07/17 2122

## 2017-03-07 NOTE — ED Triage Notes (Signed)
States she had a knee replacement at [facility] on 11/08  Had staples removed last week  Developed more pain to knee today  Incision site healing well no redness noted

## 2017-03-30 NOTE — Progress Notes (Signed)
Letter mailed from [facility] to notify of normal mammogram results.  Patient to return in one year for annual screening.  Copy to [facility]. 
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
## 2018-04-12 ENCOUNTER — Other Ambulatory Visit: Payer: Self-pay | Admitting: Family Medicine

## 2018-09-10 ENCOUNTER — Other Ambulatory Visit: Payer: Self-pay

## 2018-09-10 ENCOUNTER — Emergency Department
Admission: EM | Admit: 2018-09-10 | Discharge: 2018-09-10 | Disposition: A | Discharge status: Home / Self Care | Payer: Medicare Other | Attending: Emergency Medicine | Admitting: Emergency Medicine

## 2018-09-10 ENCOUNTER — Encounter: Payer: Self-pay | Admitting: Emergency Medicine

## 2018-09-10 DIAGNOSIS — Z853 Personal history of malignant neoplasm of breast: Secondary | ICD-10-CM | POA: Insufficient documentation

## 2018-09-10 DIAGNOSIS — R21 Rash and other nonspecific skin eruption: Secondary | ICD-10-CM | POA: Diagnosis present

## 2018-09-10 DIAGNOSIS — B356 Tinea cruris: Secondary | ICD-10-CM | POA: Diagnosis not present

## 2018-09-10 DIAGNOSIS — I10 Essential (primary) hypertension: Secondary | ICD-10-CM | POA: Diagnosis not present

## 2018-09-10 MED ORDER — CICLOPIROX OLAMINE 0.77 % EX CREA: TOPICAL_CREAM | Freq: Two times a day (BID) | CUTANEOUS | 0 refills | Status: DC

## 2018-09-10 MED ORDER — HYDROXYZINE HCL 25 MG PO TABS
25.0000 mg | ORAL_TABLET | Freq: Once | ORAL | Status: AC
  Administered 2018-09-10: 25 mg via ORAL
  Filled 2018-09-10: qty 1

## 2018-09-10 MED ORDER — FLUCONAZOLE 100 MG PO TABS
150.0000 mg | ORAL_TABLET | Freq: Once | ORAL | Status: AC
  Administered 2018-09-10: 22:00:00 150 mg via ORAL
  Filled 2018-09-10: qty 1

## 2018-09-10 NOTE — ED Notes (Signed)
Patient declined discharge vital signs. 

## 2018-09-10 NOTE — ED Triage Notes (Signed)
C/O itchy rash to back.

## 2018-09-10 NOTE — ED Provider Notes (Signed)
[facility] Emergency Department Provider Note  ____________________________________________  Time seen: Approximately 9:40 PM  I have reviewed the triage vital signs and the nursing notes.   HISTORY  Chief Complaint Rash    HPI Jacqueline Bowman is a 53 y.o. female presents to the emergency department with a pruritic irregularly distributed scaling rash between the buttocks.  Patient states that she has had symptoms for the past 2 weeks.  No vesicles or crusting.  Patient denies dysuria, hematuria, changes in vaginal discharge or low back pain.  She has never experienced similar symptoms in the past.  States that she had a recent STD work-up and results were reassuring.  Patient has been using Neosporin and alcohol and she states that measures have not been helping.        Past Medical History:  Diagnosis Date  . Breast cancer [facility])    January 2001  . Hypertension     There are no active problems to display for this patient.   Past Surgical History:  Procedure Laterality Date  . ABDOMINAL HYSTERECTOMY    . FRACTURE SURGERY    . KNEE ARTHROSCOPY      Prior to Admission medications   Medication Sig Start Date End Date Taking? Authorizing Provider  ciclopirox (LOPROX) 0.77 % cream Apply topically 2 (two) times daily. 09/10/18   Lannie Fields, PA-C  predniSONE (DELTASONE) 10 MG tablet Take 6 tablets  today, on day 2 take 5 tablets, day 3 take 4 tablets, day 4 take 3 tablets, day 5 take  2 tablets and 1 tablet the last day 11/22/16   Johnn Hai, PA-C    Allergies Patient has no known allergies.  No family history on file.  Social History Social History   Tobacco Use  . Smoking status: Never Smoker  . Smokeless tobacco: Never Used  Substance Use Topics  . Alcohol use: Yes    Comment: socially  . Drug use: No     Review of Systems  Constitutional: No fever/chills Eyes: No visual changes. No discharge ENT: No upper respiratory  complaints. Cardiovascular: no chest pain. Respiratory: no cough. No SOB. Gastrointestinal: No abdominal pain.  No nausea, no vomiting.  No diarrhea.  No constipation. Musculoskeletal: Negative for musculoskeletal pain. Skin:Patient has rash.  Neurological: Negative for headaches, focal weakness or numbness.   ____________________________________________   PHYSICAL EXAM:  VITAL SIGNS: ED Triage Vitals  Enc Vitals Group     BP 09/10/18 1846 124/72     Pulse Rate 09/10/18 1846 86     Resp 09/10/18 1846 18     Temp 09/10/18 1846 99 F (37.2 C)     Temp Source 09/10/18 1846 Oral     SpO2 09/10/18 1846 99 %     Weight 09/10/18 1817 195 lb 15.8 oz (88.9 kg)     Height 09/10/18 1846 5\' 5"  (1.651 m)     Head Circumference --      Peak Flow --      Pain Score 09/10/18 1817 0     Pain Loc --      Pain Edu? --      Excl. in [place]? --      Constitutional: Alert and oriented. Well appearing and in no acute distress. Eyes: Conjunctivae are normal. PERRL. EOMI. Head: Atraumatic. Cardiovascular: Normal rate, regular rhythm. Normal S1 and S2.  Good peripheral circulation. Respiratory: Normal respiratory effort without tachypnea or retractions. Lungs CTAB. Good air entry to the bases with  no decreased or absent breath sounds. Gastrointestinal: Bowel sounds 4 quadrants. Soft and nontender to palpation. No guarding or rigidity. No palpable masses. No distention. No CVA tenderness. Musculoskeletal: Full range of motion to all extremities. No gross deformities appreciated. Neurologic:  Normal speech and language. No gross focal neurologic deficits are appreciated.  Skin: Patient has dry, macular, irregularly shaped rash at the intergluteal cleft.  Signs of excoriation visualized.  No overlying crusting.  No vesicles. Psychiatric: Mood and affect are normal. Speech and behavior are normal. Patient exhibits appropriate insight and judgement.   ____________________________________________    LABS (all labs ordered are listed, but only abnormal results are displayed)  Labs Reviewed - No data to display ____________________________________________  EKG   ____________________________________________  RADIOLOGY   No results found.  ____________________________________________    PROCEDURES  Procedure(s) performed:    Procedures    Medications  fluconazole (DIFLUCAN) tablet 150 mg (150 mg Oral Given 09/10/18 2213)  hydrOXYzine (ATARAX/VISTARIL) tablet 25 mg (25 mg Oral Given 09/10/18 2213)     ____________________________________________   INITIAL IMPRESSION / ASSESSMENT AND PLAN / ED COURSE  Pertinent labs & imaging results that were available during my care of the patient were reviewed by me and considered in my medical decision making (see chart for details).  Review of the [place] CSRS was performed in accordance of the [place] prior to dispensing any controlled drugs.         Assessment and Plan: Tinea cruris Patient presents to the emergency department with a pruritic rash between the buttocks for the past 2 weeks.  Physical exam findings are consistent with tinea cruris.  Patient was discharged with ciclopirox and was also started on Diflucan.  Patient was advised to return to the emergency department with new or worsening symptoms.  All patient questions were answered.  ____________________________________________  FINAL CLINICAL IMPRESSION(S) / ED DIAGNOSES  Final diagnoses:  Tinea cruris      NEW MEDICATIONS STARTED DURING THIS VISIT:  ED Discharge Orders         Ordered    ciclopirox (LOPROX) 0.77 % cream  2 times daily     09/10/18 2224              This chart was dictated using voice recognition software/Dragon. Despite best efforts to proofread, errors can occur which can change the meaning. Any change was purely unintentional.    Lannie Fields, PA-C 09/10/18 2237    Schuyler Amor, MD 09/10/18 2244

## 2018-11-15 ENCOUNTER — Other Ambulatory Visit: Payer: Self-pay

## 2018-11-15 DIAGNOSIS — Z20822 Contact with and (suspected) exposure to covid-19: Secondary | ICD-10-CM

## 2018-11-16 LAB — NOVEL CORONAVIRUS, NAA: SARS-CoV-2, NAA: NOT DETECTED

## 2019-08-28 DIAGNOSIS — M545 Low back pain: Secondary | ICD-10-CM | POA: Diagnosis not present

## 2019-08-28 DIAGNOSIS — M1991 Primary osteoarthritis, unspecified site: Secondary | ICD-10-CM | POA: Diagnosis not present

## 2019-08-28 DIAGNOSIS — M1711 Unilateral primary osteoarthritis, right knee: Secondary | ICD-10-CM | POA: Diagnosis not present

## 2019-08-28 DIAGNOSIS — Z96659 Presence of unspecified artificial knee joint: Secondary | ICD-10-CM | POA: Diagnosis not present

## 2019-08-28 DIAGNOSIS — M25561 Pain in right knee: Secondary | ICD-10-CM | POA: Diagnosis not present

## 2019-10-25 DIAGNOSIS — M1991 Primary osteoarthritis, unspecified site: Secondary | ICD-10-CM | POA: Diagnosis not present

## 2019-10-25 DIAGNOSIS — M25561 Pain in right knee: Secondary | ICD-10-CM | POA: Diagnosis not present

## 2019-10-25 DIAGNOSIS — Z96659 Presence of unspecified artificial knee joint: Secondary | ICD-10-CM | POA: Diagnosis not present

## 2019-10-25 DIAGNOSIS — M1711 Unilateral primary osteoarthritis, right knee: Secondary | ICD-10-CM | POA: Diagnosis not present

## 2019-10-25 DIAGNOSIS — M545 Low back pain: Secondary | ICD-10-CM | POA: Diagnosis not present

## 2019-10-31 ENCOUNTER — Ambulatory Visit: Payer: Self-pay | Admitting: Family Medicine

## 2019-12-11 ENCOUNTER — Ambulatory Visit: Payer: Self-pay

## 2019-12-14 ENCOUNTER — Ambulatory Visit: Payer: Medicare Other | Attending: Internal Medicine

## 2019-12-14 DIAGNOSIS — Z23 Encounter for immunization: Secondary | ICD-10-CM

## 2019-12-14 NOTE — Progress Notes (Signed)
   Covid-19 Vaccination Clinic  Name:  Jacqueline Bowman    MRN: [id number] DOB: 1965-08-10  12/14/2019  [name] was observed post Covid-19 immunization for 15 minutes without incident. She was provided with Vaccine Information Sheet and instruction to access the V-Safe system.   [name] was instructed to call 911 with any severe reactions post vaccine: Marland Kitchen Difficulty breathing  . Swelling of face and throat  . A fast heartbeat  . A bad rash all over body  . Dizziness and weakness

## 2020-01-03 DIAGNOSIS — M79671 Pain in right foot: Secondary | ICD-10-CM | POA: Diagnosis not present

## 2020-01-03 DIAGNOSIS — M545 Low back pain, unspecified: Secondary | ICD-10-CM | POA: Diagnosis not present

## 2020-01-03 DIAGNOSIS — M1711 Unilateral primary osteoarthritis, right knee: Secondary | ICD-10-CM | POA: Diagnosis not present

## 2020-01-03 DIAGNOSIS — S93621A Sprain of tarsometatarsal ligament of right foot, initial encounter: Secondary | ICD-10-CM | POA: Diagnosis not present

## 2020-01-03 DIAGNOSIS — M25561 Pain in right knee: Secondary | ICD-10-CM | POA: Diagnosis not present

## 2020-01-03 DIAGNOSIS — M1991 Primary osteoarthritis, unspecified site: Secondary | ICD-10-CM | POA: Diagnosis not present

## 2020-01-03 DIAGNOSIS — Z96653 Presence of artificial knee joint, bilateral: Secondary | ICD-10-CM | POA: Diagnosis not present

## 2020-01-11 DIAGNOSIS — S93621A Sprain of tarsometatarsal ligament of right foot, initial encounter: Secondary | ICD-10-CM | POA: Diagnosis not present

## 2020-01-14 DIAGNOSIS — S9031XD Contusion of right foot, subsequent encounter: Secondary | ICD-10-CM | POA: Diagnosis not present

## 2020-01-14 DIAGNOSIS — M19071 Primary osteoarthritis, right ankle and foot: Secondary | ICD-10-CM | POA: Diagnosis not present

## 2020-02-28 DIAGNOSIS — M1711 Unilateral primary osteoarthritis, right knee: Secondary | ICD-10-CM | POA: Diagnosis not present

## 2020-02-28 DIAGNOSIS — Z96659 Presence of unspecified artificial knee joint: Secondary | ICD-10-CM | POA: Diagnosis not present

## 2020-02-28 DIAGNOSIS — M25561 Pain in right knee: Secondary | ICD-10-CM | POA: Diagnosis not present

## 2020-02-28 DIAGNOSIS — G894 Chronic pain syndrome: Secondary | ICD-10-CM | POA: Diagnosis not present

## 2020-02-28 DIAGNOSIS — M1991 Primary osteoarthritis, unspecified site: Secondary | ICD-10-CM | POA: Diagnosis not present

## 2020-04-10 ENCOUNTER — Ambulatory Visit: Payer: Self-pay

## 2020-04-10 NOTE — Telephone Encounter (Signed)
  Reason for Disposition . [1] Small swelling or lump AND [2] unexplained AND [3] present > 1 week  Answer Assessment - Initial Assessment Questions 1. APPEARANCE of SWELLING: "What does it look like?" (e.g., lymph node, insect bite, mole)     2 nodules 2. SIZE: "How large is the swelling?" (inches, cm or compare to coins)     50 cent piece 3. LOCATION: "Where is the swelling located?"     Under left arm 4. ONSET: "When did the swelling start?"     Noticed it getting bigger last night 5. PAIN: "Is it painful?" If Yes, ask: "How much?"     Yes 6. ITCH: "Does it itch?" If Yes, ask: "How much?"     No 7. CAUSE: "What do you think caused the swelling?"     I don't know 8. OTHER SYMPTOMS: "Do you have any other symptoms?" (e.g., fever)     No  Protocols used: SKIN LUMP OR LOCALIZED SWELLING-A-AH

## 2020-04-10 NOTE — Telephone Encounter (Signed)
Patient called and says she found a lump under her left arm that has grown in size to a half dollar over several weeks, it's tender/painful to the touch. She denies any other symptoms. I advised she will need to go to the UC due to she doesn't have a PCP. She asked to be scheduled at [facility] the first available new patient appointment. Appointment scheduled on Monday, 04/14/20 at 1020 with Dr. Brita Romp. She says she will go to the UC as well because she has a drainage from her naval.   Answer Assessment - Initial Assessment Questions 1. APPEARANCE of SWELLING: "What does it look like?" (e.g., lymph node, insect bite, mole)     2 nodules 2. SIZE: "How large is the swelling?" (inches, cm or compare to coins)     50 cent piece 3. LOCATION: "Where is the swelling located?"     Under left arm 4. ONSET: "When did the swelling start?"     Noticed it getting bigger last night 5. PAIN: "Is it painful?" If Yes, ask: "How much?"     Yes 6. ITCH: "Does it itch?" If Yes, ask: "How much?"     No 7. CAUSE: "What do you think caused the swelling?"     I don't know 8. OTHER SYMPTOMS: "Do you have any other symptoms?" (e.g., fever)     No  Protocols used: SKIN LUMP OR LOCALIZED SWELLING-A-AH

## 2020-04-14 ENCOUNTER — Encounter: Payer: Self-pay | Admitting: Family Medicine

## 2020-04-14 ENCOUNTER — Telehealth (INDEPENDENT_AMBULATORY_CARE_PROVIDER_SITE_OTHER): Payer: Medicare Other | Admitting: Family Medicine

## 2020-04-14 ENCOUNTER — Telehealth: Payer: Self-pay

## 2020-04-14 ENCOUNTER — Ambulatory Visit: Payer: Self-pay | Admitting: *Deleted

## 2020-04-14 VITALS — Ht 65.0 in | Wt 174.0 lb

## 2020-04-14 DIAGNOSIS — I10 Essential (primary) hypertension: Secondary | ICD-10-CM

## 2020-04-14 DIAGNOSIS — Z853 Personal history of malignant neoplasm of breast: Secondary | ICD-10-CM

## 2020-04-14 DIAGNOSIS — S31105A Unspecified open wound of abdominal wall, periumbilic region without penetration into peritoneal cavity, initial encounter: Secondary | ICD-10-CM | POA: Diagnosis not present

## 2020-04-14 DIAGNOSIS — R490 Dysphonia: Secondary | ICD-10-CM | POA: Diagnosis not present

## 2020-04-14 DIAGNOSIS — M795 Residual foreign body in soft tissue: Secondary | ICD-10-CM

## 2020-04-14 MED ORDER — CETIRIZINE HCL 10 MG PO TABS: 10.0000 mg | ORAL_TABLET | Freq: Every day | ORAL | 3 refills | Status: DC

## 2020-04-14 MED ORDER — DOXYCYCLINE HYCLATE 100 MG PO TABS: 100.0000 mg | ORAL_TABLET | Freq: Two times a day (BID) | ORAL | 0 refills | Status: AC

## 2020-04-14 MED ORDER — FLUTICASONE PROPIONATE 50 MCG/ACT NA SUSP: 2.0000 | Freq: Every day | NASAL | 6 refills | Status: DC

## 2020-04-14 NOTE — Assessment & Plan Note (Signed)
Patient with left breast cancer in 2000 status post bilateral mastectomy and reconstruction Repeat mammogram ordered today

## 2020-04-14 NOTE — Assessment & Plan Note (Signed)
Previously well controlled per patient report Not currently on any medications, but managed by lifestyle Continue diet and exercise Reassess at next in-person visit and recheck labs at that time

## 2020-04-14 NOTE — Progress Notes (Signed)
New patient visit   Patient: Jacqueline Bowman   DOB: 25-Aug-1965   55 y.o. Female  MRN: [id number] Visit Date: 04/14/2020  Today's healthcare provider: Lavon Paganini, MD   Virtual Visit via Video Note  I connected with [name] on 04/14/20 at 10:20 AM EST by a video enabled telemedicine application and verified that I am speaking with the correct person using two identifiers.  Location:  Patient location: home Provider location: home office Persons involved in the visit: patient, provider   I discussed the limitations of evaluation and management by telemedicine and the availability of in person appointments. The patient expressed understanding and agreed to proceed.  Chief Complaint  Patient presents with  . New Patient (Initial Visit)   Subjective    Jacqueline Bowman is a 55 y.o. female who presents today as a new patient to establish care.  HPI  Patient transferring from [facility].  Patient has a history of left breast cancer. Patient has history bilateral mastectomy 2000.  Patient requesting referral for mammogram. Patient has a history of bilateral knee surgery (TKR with a lower limb rod placement to the ankle), patient reports taking percocet for pain. On disability. Followed by pain management at [facility] in [place].  They are managing medications.  HTN: Previously on medications, lifestyle controlled currently. Lost weight and this helped.  Taking topamax for anxiety and depression.  Tried another medication in the past, but stopped due to weight gain. Topamax has helped with weight loss as well.   Hoarseness, chronically.  Worse in the mornings or with talking a lot or loudly. Worried that this could be cancer given her h/o cancer.  She has some allergic rhinitis with   Family history of colon cancer - has had 2 colonoscopies previously.  Had breast reconstruction in 2000 and had incision at umbilicus that has continued to  drain.  Dr Mee Hives at [facility]. Able to place a qtip into the hole since that time.  Cotton part of the qtip is stuck in the incision now.  Past Medical History:  Diagnosis Date  . Breast cancer [facility])    January 2000 left  . Hypertension    Past Surgical History:  Procedure Laterality Date  . ABDOMINAL HYSTERECTOMY  2004  . BREAST SURGERY    . FRACTURE SURGERY  2018   right foot to correct knee  . MASTECTOMY Bilateral 2004  . TOTAL KNEE ARTHROPLASTY Bilateral 2018   2018 left 2019 right   Family Status  Relation Name Status  . Mother  Deceased at age 23       breast cancer  . Father  Deceased  . Sister  Alive  . Brother  Alive  . Son  Alive  . Brother  Alive   Family History  Problem Relation Age of Onset  . Breast cancer Mother 16  . Hypertension Father   . Hyperlipidemia Father   . Obesity Father   . Colon cancer Brother 57  . Heart disease Brother    Social History   Socioeconomic History  . Marital status: Legally Separated    Spouse name: Not on file  . Number of children: 1  . Years of education: Not on file  . Highest education level: Not on file  Occupational History  . Occupation: disabled  Tobacco Use  . Smoking status: Former Smoker    Types: Cigarettes  . Smokeless tobacco: Never Used  . Tobacco comment: socailly smoking  for a few years in the distant past  Vaping Use  . Vaping Use: Never used  Substance and Sexual Activity  . Alcohol use: Not Currently    Comment: socially  . Drug use: No  . Sexual activity: Not Currently  Other Topics Concern  . Not on file  Social History Narrative  . Not on file   Social Determinants of Health   Financial Resource Strain: Not on file  Food Insecurity: Not on file  Transportation Needs: Not on file  Physical Activity: Not on file  Stress: Not on file  Social Connections: Not on file   Outpatient Medications Prior to Visit  Medication Sig  . diclofenac Sodium (VOLTAREN) 1 % GEL Apply 1 application  topically 4 (four) times daily.  Marland Kitchen oxyCODONE-acetaminophen (PERCOCET) 10-325 MG tablet TAKE ONE TAB EVERY 4 HOURS AS NEEDED FOR PAIN. MAX 4 TAB PER DAY. DONT TAKE WITH SLEEP AID/BENZO  . topiramate (TOPAMAX) 50 MG tablet Take 50 mg by mouth daily.  . [DISCONTINUED] ciclopirox (LOPROX) 0.77 % cream Apply topically 2 (two) times daily.  . [DISCONTINUED] predniSONE (DELTASONE) 10 MG tablet Take 6 tablets  today, on day 2 take 5 tablets, day 3 take 4 tablets, day 4 take 3 tablets, day 5 take  2 tablets and 1 tablet the last day   No facility-administered medications prior to visit.   No Known Allergies  Immunization History  Administered Date(s) Administered  . Moderna Sars-Covid-2 Vaccination 12/14/2019    Health Maintenance  Topic Date Due  . Hepatitis C Screening  Never done  . HIV Screening  Never done  . TETANUS/TDAP  Never done  . PAP SMEAR-Modifier  Never done  . COLONOSCOPY (Pts 45-25yrs Insurance coverage will need to be confirmed)  Never done  . MAMMOGRAM  Never done  . INFLUENZA VACCINE  Never done  . COVID-19 Vaccine (2 - Moderna risk 4-dose series) 01/11/2020    Patient Care Team: [facility] as PCP - General (General Practice) Rico Junker, RN as Registered Nurse Theodore Demark, RN as Registered Nurse  Review of Systems  Constitutional: Negative.   HENT: Negative.   Eyes: Negative.   Respiratory: Negative.   Cardiovascular: Negative.   Gastrointestinal: Negative.   Endocrine: Negative.   Genitourinary: Negative.   Musculoskeletal: Positive for arthralgias, gait problem and myalgias.  Skin: Negative.   Allergic/Immunologic: Negative.   Hematological: Negative.   Psychiatric/Behavioral: Negative.       Objective    Ht 5\' 5"  (1.651 m)   Wt 174 lb (78.9 kg)   BMI 28.96 kg/m  Physical Exam Constitutional:      General: She is not in acute distress.    Appearance: Normal appearance.  HENT:     Head: Normocephalic.   Pulmonary:     Effort: Pulmonary effort is normal. No respiratory distress.  Neurological:     Mental Status: She is alert and oriented to person, place, and time.  Psychiatric:        Behavior: Behavior normal.      Depression Screen PHQ 2/9 Scores 04/14/2020  PHQ - 2 Score 0   No results found for any visits on 04/14/20.  Assessment & Plan      Follow Up Instructions: I discussed the assessment and treatment plan with the patient. The patient was provided an opportunity to ask questions and all were answered. The patient agreed with the plan and demonstrated an understanding of the instructions.   The  patient was advised to call back or seek an in-person evaluation if the symptoms worsen or if the condition fails to improve as anticipated.  I provided 45 minutes of non-face-to-face time during this encounter.   Problem List Items Addressed This Visit      Cardiovascular and Mediastinum   Primary hypertension    Previously well controlled per patient report Not currently on any medications, but managed by lifestyle Continue diet and exercise Reassess at next in-person visit and recheck labs at that time        Other   History of breast cancer    Patient with left breast cancer in 2000 status post bilateral mastectomy and reconstruction Repeat mammogram ordered today      Relevant Orders   MM 3D SCREEN BREAST BILATERAL   Chronic hoarseness    Longstanding issue that is worse in the mornings Patient is concerned about possibility of cancer given her history of breast cancer and family history of several different cancers Reassurance given that this is more often related to allergic rhinitis with postnasal drip versus GERD Patient does have some symptoms of both We will start with Flonase and Zyrtec If not improving in the next 2 to 3 weeks, would consider trying PPI and then would consider referral to ENT for further evaluation      Open wound of umbilical region  - Primary    Patient reports open surgical wound that is several inches deep at her umbilicus that has been present since her breast reconstruction in 2000 Chronic drainage and what sounds like sinus tract Now with foreign body-cotton off the tip of the Q-tip-stuck in the wound and purulent drainage Will refer to surgery for further evaluation and possible removal foreign body Treat empirically with doxycycline for 7 days given purulent discharge and foreign body present Return precautions discussed      Relevant Orders   Ambulatory referral to General Surgery    Other Visit Diagnoses    Foreign body (FB) in soft tissue       Relevant Orders   Ambulatory referral to General Surgery       Return in about 3 months (around 07/13/2020) for CPE, AWV.     I, Lavon Paganini, MD, have reviewed all documentation for this visit. The documentation on 04/14/20 for the exam, diagnosis, procedures, and orders are all accurate and complete.   Bacigalupo, Dionne Bucy, MD, MPH [facility]

## 2020-04-14 NOTE — Telephone Encounter (Signed)
Pt requests that the referral for mammogram be resubmitted. Pt stated the referral has to be for a diagnostic mammogram instead of a screening mammogram.

## 2020-04-14 NOTE — Telephone Encounter (Signed)
Patient is calling because she had a new patient with Dr. B this morning - patient calling [facility] to schedule mammogram. Was told that she need to have an order for a diagnotic mammogram. Please advise CB- [phone]

## 2020-04-14 NOTE — Assessment & Plan Note (Signed)
Longstanding issue that is worse in the mornings Patient is concerned about possibility of cancer given her history of breast cancer and family history of several different cancers Reassurance given that this is more often related to allergic rhinitis with postnasal drip versus GERD Patient does have some symptoms of both We will start with Flonase and Zyrtec If not improving in the next 2 to 3 weeks, would consider trying PPI and then would consider referral to ENT for further evaluation

## 2020-04-14 NOTE — Assessment & Plan Note (Signed)
Patient reports open surgical wound that is several inches deep at her umbilicus that has been present since her breast reconstruction in 2000 Chronic drainage and what sounds like sinus tract Now with foreign body-cotton off the tip of the Q-tip-stuck in the wound and purulent drainage Will refer to surgery for further evaluation and possible removal foreign body Treat empirically with doxycycline for 7 days given purulent discharge and foreign body present Return precautions discussed

## 2020-04-14 NOTE — Telephone Encounter (Signed)
Patient had new patient appt this morning with Dr. Jacinto Reap. Provider sent three scripts for her. Patient is calling to ask advice. Is it ok that Dr. B right the scripts on this medication because she does have a pain management Dr. Please advise  Called patient to review request or advise concerning Rx written by PCP. Patient wanted to make PCP aware she does have a pain management doctor in [facility] in  case any of the medications were for pain. Reviewed with patient medications and general uses of why meds are prescribed. Fluticasone propionate, Cetirizine, and doxycyline hyclate. Patient also clarifying clinic is to be  aware of a change in her order for her mammogram. Please advise if order other than 3D mammogram ordered.  Patient verbalized understanding .  Reason for Disposition . General information question, no triage required and triager able to answer question  Answer Assessment - Initial Assessment Questions 1. REASON FOR CALL or QUESTION: "What is your reason for calling today?" or "How can I best help you?" or "What question do you have that I can help answer?"     Patient wanted to make sure PCP was aware she has a pain management doctor in [place] and that the 3 new Rx prescribed would be allowed.  Protocols used: INFORMATION ONLY CALL - NO TRIAGE-A-AH

## 2020-04-15 ENCOUNTER — Telehealth: Payer: Self-pay

## 2020-04-15 DIAGNOSIS — R590 Localized enlarged lymph nodes: Secondary | ICD-10-CM

## 2020-04-15 DIAGNOSIS — Z853 Personal history of malignant neoplasm of breast: Secondary | ICD-10-CM

## 2020-04-15 NOTE — Telephone Encounter (Signed)
Copied from [place] [id number]. Topic: General - Inquiry >> Apr 15, 2020 10:01 AM Greggory Keen D wrote: Reason for CRM: Pt called again this morning about getting a diagnostic mammogram order to [facility].  Pt is vert anxious about getting her mammogram done.

## 2020-04-15 NOTE — Telephone Encounter (Signed)
None of those meds are pain meds. [place] for me to rx. 3d mammo was ordered

## 2020-04-15 NOTE — Telephone Encounter (Signed)
I ordered screening. If she is having possible, then yes diagnostic with ultrasound

## 2020-04-15 NOTE — Telephone Encounter (Signed)
Patient has been seen once by Dr. Jacinto Reap

## 2020-04-15 NOTE — Telephone Encounter (Signed)
Please advise 

## 2020-04-15 NOTE — Telephone Encounter (Signed)
MAMMO DIGITAL DIAGNOSTIC LEFT, US BREAST LIMITED LEFT? And Right?

## 2020-04-16 NOTE — Telephone Encounter (Signed)
Patient reports she has a knot in left underarm and another knot on the bra line. Patient is requesting diagnostic and would like to go to [place]. Please advise.

## 2020-04-17 ENCOUNTER — Other Ambulatory Visit: Payer: Self-pay

## 2020-04-17 ENCOUNTER — Telehealth: Payer: Self-pay | Admitting: General Surgery

## 2020-04-17 ENCOUNTER — Encounter: Payer: Self-pay | Admitting: General Surgery

## 2020-04-17 ENCOUNTER — Other Ambulatory Visit
Admission: RE | Admit: 2020-04-17 | Discharge: 2020-04-17 | Disposition: A | Discharge status: Home / Self Care | Payer: Medicare Other | Source: Ambulatory Visit | Attending: General Surgery | Admitting: General Surgery

## 2020-04-17 ENCOUNTER — Ambulatory Visit (INDEPENDENT_AMBULATORY_CARE_PROVIDER_SITE_OTHER): Payer: Medicare Other | Admitting: General Surgery

## 2020-04-17 VITALS — BP 125/73 | HR 71 | Temp 98.4°F | Ht 65.0 in | Wt 172.0 lb

## 2020-04-17 DIAGNOSIS — Z20822 Contact with and (suspected) exposure to covid-19: Secondary | ICD-10-CM | POA: Diagnosis not present

## 2020-04-17 DIAGNOSIS — Z01812 Encounter for preprocedural laboratory examination: Secondary | ICD-10-CM | POA: Insufficient documentation

## 2020-04-17 DIAGNOSIS — Z189 Retained foreign body fragments, unspecified material: Secondary | ICD-10-CM

## 2020-04-17 LAB — SARS CORONAVIRUS 2 (TAT 6-24 HRS): SARS Coronavirus 2: NEGATIVE

## 2020-04-17 NOTE — Progress Notes (Signed)
Patient ID: Jacqueline Bowman, female   DOB: 1966/02/28, 55 y.o.   MRN: [id number]  Chief Complaint  Patient presents with  . New Patient (Initial Visit)    Umbilical foreign object    HPI Jacqueline Bowman is a 55 y.o. female.   She has been referred by her primary care provider, Dr. Brita Romp, to evaluate for possible umbilical foreign body.  Ms. Riding reports that she had bilateral mastectomies with immediate tissue reconstruction in 2000.  She said that some of her surgery was done at [facility], but her reconstruction was done at [facility].  She says that she has had "drainage" from her umbilicus ever since that time.  She says that she never approached any of her surgeons regarding this, but simply would stick a Q-tip into her navel to clean things out.  She says that she did this last week but that when she removed the Q-tip, the cotton component was no longer on the stick.  She had a video visit with Dr. Brita Romp.  She was placed on doxycycline and referred to general surgery for further evaluation.  [name] states that the drainage that she notices is somewhat thick and has a foul odor to it it is generally white to slightly yellowish.  It does not freely drain; she finds it when she probes the umbilicus with a Q-tip.  She has not experienced any fevers or chills.  No nausea or vomiting.  She also says that she has a lump in her skin near one of her surgical scars, as well as one just below her bra strap line on the left.   Past Medical History:  Diagnosis Date  . Breast cancer [facility])    January 2000 left  . Hypertension     Past Surgical History:  Procedure Laterality Date  . ABDOMINAL HYSTERECTOMY  2004  . BREAST SURGERY    . FRACTURE SURGERY  2018   right foot to correct knee  . MASTECTOMY Bilateral 2004  . TOTAL KNEE ARTHROPLASTY Bilateral 2018   2018 left 2019 right    Family History  Problem Relation Age of Onset  . Breast cancer Mother 38  . Hypertension Father   .  Hyperlipidemia Father   . Obesity Father   . Colon cancer Brother 35  . Heart disease Brother     Social History Social History   Tobacco Use  . Smoking status: Former Smoker    Types: Cigarettes  . Smokeless tobacco: Never Used  . Tobacco comment: socailly smoking for a few years in the distant past  Vaping Use  . Vaping Use: Never used  Substance Use Topics  . Alcohol use: Not Currently    Comment: socially  . Drug use: No    No Known Allergies  Current Outpatient Medications  Medication Sig Dispense Refill  . cetirizine (ZYRTEC) 10 MG tablet Take 1 tablet (10 mg total) by mouth daily. 90 tablet 3  . diclofenac Sodium (VOLTAREN) 1 % GEL Apply 1 application topically 4 (four) times daily.    Marland Kitchen doxycycline (VIBRA-TABS) 100 MG tablet Take 1 tablet (100 mg total) by mouth 2 (two) times daily for 7 days. 14 tablet 0  . fluticasone (FLONASE) 50 MCG/ACT nasal spray Place 2 sprays into both nostrils daily. 16 g 6  . oxyCODONE-acetaminophen (PERCOCET) 10-325 MG tablet TAKE ONE TAB EVERY 4 HOURS AS NEEDED FOR PAIN. MAX 4 TAB PER DAY. DONT TAKE WITH SLEEP AID/BENZO    . topiramate (TOPAMAX)  50 MG tablet Take 50 mg by mouth daily.     No current facility-administered medications for this visit.    Review of Systems Review of Systems  HENT: Positive for postnasal drip.   All other systems reviewed and are negative.   Blood pressure 125/73, pulse 71, temperature 98.4 F (36.9 C), temperature source Oral, height 5\' 5"  (1.651 m), weight 172 lb (78 kg), SpO2 97 %. Body mass index is 28.62 kg/m.  Physical Exam Physical Exam Constitutional:      General: She is not in acute distress.    Appearance: Normal appearance.  HENT:     Head: Normocephalic and atraumatic.     Nose:     Comments: Covered with a mask    Mouth/Throat:     Comments: Covered with a mask Eyes:     General:        Right eye: No discharge.        Left eye: No discharge.  Cardiovascular:     Rate and  Rhythm: Normal rate and regular rhythm.  Pulmonary:     Effort: Pulmonary effort is normal. No respiratory distress.     Breath sounds: Normal breath sounds.  Abdominal:       Comments: Scars consistent with prior primary tissue flap reconstruction.  Notably, there is no evidence of prior incision at the umbilicus.  The umbilicus shows evidence of excoriation.  No drainage appreciated.   Genitourinary:    Comments: Deferred Musculoskeletal:        General: No swelling or tenderness.  Skin:    General: Skin is warm and dry.          Comments: Small, soft, well-circumscribed and mobile subcutaneous mass on the left flank.  Neurological:     General: No focal deficit present.     Mental Status: She is alert and oriented to person, place, and time.  Psychiatric:        Mood and Affect: Mood normal.        Behavior: Behavior normal.     Data Reviewed I reviewed Dr. Nancy Nordmann video visit note from April 14, 2020.  A number of medical comorbidities were addressed at that time, as well as the concern for possible sinus tract and foreign body retention at the umbilicus.  No relevant imaging or recent labs have been performed.  Assessment I attempted to probe the umbilicus to see if I could identify any retained material.  The patient has an extremely deep and narrow navel.  Caseous material, similar to that found in sebaceous cysts was present, but no obvious sinus tract or wound was identified.  Secondary to discomfort, I had to cease my efforts, despite the use of lidocaine with epinephrine to try and numb the area.  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  The other area within her left axillary scar feels like scar tissue without any concerning features.  Plan Due to patient intolerance, I was unable to thoroughly evaluate the umbilical area.  I do not think she has a sinus tract there, as no incision was made in this area at the time of her prior  surgery.  My suspicion is that she simply has an extremely deep navel and collects shed skin cells in the crevasse, which she interprets as pus, secondary to the odor.  I was unable to find any foreign body in the area.  I discussed the possibility of performing a more thorough exploration in  the operating room, which Ms. Kloc stated she would like to have done.  I discussed the risks of the procedure which include, but are not limited to, bleeding, infection, negative exploration, damage to surrounding tissues or structures, need to excise skin or soft tissue.  Ms. Lausch expressed a desire to proceed and we will work on getting her scheduled.    Fredirick Maudlin 04/17/2020, 12:02 PM

## 2020-04-17 NOTE — Addendum Note (Signed)
Addended by: Virginia Crews on: 04/17/2020 11:49 AM   Modules accepted: Orders

## 2020-04-17 NOTE — H&P (View-Only) (Signed)
Patient ID: Jacqueline Bowman, female   DOB: 1966/02/28, 55 y.o.   MRN: [id number]  Chief Complaint  Patient presents with  . New Patient (Initial Visit)    Umbilical foreign object    HPI Jacqueline Bowman is a 55 y.o. female.   She has been referred by her primary care provider, Dr. Brita Romp, to evaluate for possible umbilical foreign body.  Ms. Riding reports that she had bilateral mastectomies with immediate tissue reconstruction in 2000.  She said that some of her surgery was done at [facility], but her reconstruction was done at [facility].  She says that she has had "drainage" from her umbilicus ever since that time.  She says that she never approached any of her surgeons regarding this, but simply would stick a Q-tip into her navel to clean things out.  She says that she did this last week but that when she removed the Q-tip, the cotton component was no longer on the stick.  She had a video visit with Dr. Brita Romp.  She was placed on doxycycline and referred to general surgery for further evaluation.  [name] states that the drainage that she notices is somewhat thick and has a foul odor to it it is generally white to slightly yellowish.  It does not freely drain; she finds it when she probes the umbilicus with a Q-tip.  She has not experienced any fevers or chills.  No nausea or vomiting.  She also says that she has a lump in her skin near one of her surgical scars, as well as one just below her bra strap line on the left.   Past Medical History:  Diagnosis Date  . Breast cancer [facility])    January 2000 left  . Hypertension     Past Surgical History:  Procedure Laterality Date  . ABDOMINAL HYSTERECTOMY  2004  . BREAST SURGERY    . FRACTURE SURGERY  2018   right foot to correct knee  . MASTECTOMY Bilateral 2004  . TOTAL KNEE ARTHROPLASTY Bilateral 2018   2018 left 2019 right    Family History  Problem Relation Age of Onset  . Breast cancer Mother 38  . Hypertension Father   .  Hyperlipidemia Father   . Obesity Father   . Colon cancer Brother 35  . Heart disease Brother     Social History Social History   Tobacco Use  . Smoking status: Former Smoker    Types: Cigarettes  . Smokeless tobacco: Never Used  . Tobacco comment: socailly smoking for a few years in the distant past  Vaping Use  . Vaping Use: Never used  Substance Use Topics  . Alcohol use: Not Currently    Comment: socially  . Drug use: No    No Known Allergies  Current Outpatient Medications  Medication Sig Dispense Refill  . cetirizine (ZYRTEC) 10 MG tablet Take 1 tablet (10 mg total) by mouth daily. 90 tablet 3  . diclofenac Sodium (VOLTAREN) 1 % GEL Apply 1 application topically 4 (four) times daily.    Marland Kitchen doxycycline (VIBRA-TABS) 100 MG tablet Take 1 tablet (100 mg total) by mouth 2 (two) times daily for 7 days. 14 tablet 0  . fluticasone (FLONASE) 50 MCG/ACT nasal spray Place 2 sprays into both nostrils daily. 16 g 6  . oxyCODONE-acetaminophen (PERCOCET) 10-325 MG tablet TAKE ONE TAB EVERY 4 HOURS AS NEEDED FOR PAIN. MAX 4 TAB PER DAY. DONT TAKE WITH SLEEP AID/BENZO    . topiramate (TOPAMAX)  50 MG tablet Take 50 mg by mouth daily.     No current facility-administered medications for this visit.    Review of Systems Review of Systems  HENT: Positive for postnasal drip.   All other systems reviewed and are negative.   Blood pressure 125/73, pulse 71, temperature 98.4 F (36.9 C), temperature source Oral, height 5\' 5"  (1.651 m), weight 172 lb (78 kg), SpO2 97 %. Body mass index is 28.62 kg/m.  Physical Exam Physical Exam Constitutional:      General: She is not in acute distress.    Appearance: Normal appearance.  HENT:     Head: Normocephalic and atraumatic.     Nose:     Comments: Covered with a mask    Mouth/Throat:     Comments: Covered with a mask Eyes:     General:        Right eye: No discharge.        Left eye: No discharge.  Cardiovascular:     Rate and  Rhythm: Normal rate and regular rhythm.  Pulmonary:     Effort: Pulmonary effort is normal. No respiratory distress.     Breath sounds: Normal breath sounds.  Abdominal:       Comments: Scars consistent with prior primary tissue flap reconstruction.  Notably, there is no evidence of prior incision at the umbilicus.  The umbilicus shows evidence of excoriation.  No drainage appreciated.   Genitourinary:    Comments: Deferred Musculoskeletal:        General: No swelling or tenderness.  Skin:    General: Skin is warm and dry.          Comments: Small, soft, well-circumscribed and mobile subcutaneous mass on the left flank.  Neurological:     General: No focal deficit present.     Mental Status: She is alert and oriented to person, place, and time.  Psychiatric:        Mood and Affect: Mood normal.        Behavior: Behavior normal.     Data Reviewed I reviewed Dr. Nancy Nordmann video visit note from April 14, 2020.  A number of medical comorbidities were addressed at that time, as well as the concern for possible sinus tract and foreign body retention at the umbilicus.  No relevant imaging or recent labs have been performed.  Assessment I attempted to probe the umbilicus to see if I could identify any retained material.  The patient has an extremely deep and narrow navel.  Caseous material, similar to that found in sebaceous cysts was present, but no obvious sinus tract or wound was identified.  Secondary to discomfort, I had to cease my efforts, despite the use of lidocaine with epinephrine to try and numb the area.  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  The other area within her left axillary scar feels like scar tissue without any concerning features.  Plan Due to patient intolerance, I was unable to thoroughly evaluate the umbilical area.  I do not think she has a sinus tract there, as no incision was made in this area at the time of her prior  surgery.  My suspicion is that she simply has an extremely deep navel and collects shed skin cells in the crevasse, which she interprets as pus, secondary to the odor.  I was unable to find any foreign body in the area.  I discussed the possibility of performing a more thorough exploration in  the operating room, which Ms. Kloc stated she would like to have done.  I discussed the risks of the procedure which include, but are not limited to, bleeding, infection, negative exploration, damage to surrounding tissues or structures, need to excise skin or soft tissue.  Ms. Lausch expressed a desire to proceed and we will work on getting her scheduled.    Fredirick Maudlin 04/17/2020, 12:02 PM

## 2020-04-17 NOTE — Telephone Encounter (Signed)
Patient has been advised of Pre-Admission date/time, COVID Testing date and Surgery date.  Surgery Date: 04/18/20 Preadmission Testing Date: 04/18/20 (arrived 2 hours early) Covid Testing Date: 04/17/20 - patient advised to go to the [facility] ([place]) now  Patient has been made aware to arrive between 10:30 to 11:00 the day of surgery 04/18/20.  Patient voices understanding with all the above.

## 2020-04-17 NOTE — Patient Instructions (Signed)
Our surgery scheduler will contact you in 24-48. If you have not heard anything by Monday, please call our office. Please have that blue sheet available when she calls to write down important information.  You may wash out the belly button in the shower with soap and water. Refrain from using Qtips.

## 2020-04-18 ENCOUNTER — Ambulatory Visit
Admission: RE | Admit: 2020-04-18 | Discharge: 2020-04-18 | Disposition: A | Discharge status: Home / Self Care | Payer: Medicare Other | Attending: General Surgery | Admitting: General Surgery

## 2020-04-18 ENCOUNTER — Ambulatory Visit: Payer: Medicare Other | Admitting: Anesthesiology

## 2020-04-18 ENCOUNTER — Encounter
Admission: RE | Disposition: A | Discharge status: Home / Self Care | Payer: Self-pay | Source: Home / Self Care | Attending: General Surgery

## 2020-04-18 ENCOUNTER — Other Ambulatory Visit: Payer: Self-pay

## 2020-04-18 ENCOUNTER — Encounter: Payer: Self-pay | Admitting: General Surgery

## 2020-04-18 DIAGNOSIS — Z0389 Encounter for observation for other suspected diseases and conditions ruled out: Secondary | ICD-10-CM | POA: Diagnosis not present

## 2020-04-18 DIAGNOSIS — T189XXA Foreign body of alimentary tract, part unspecified, initial encounter: Secondary | ICD-10-CM | POA: Diagnosis not present

## 2020-04-18 DIAGNOSIS — Z853 Personal history of malignant neoplasm of breast: Secondary | ICD-10-CM | POA: Insufficient documentation

## 2020-04-18 DIAGNOSIS — I10 Essential (primary) hypertension: Secondary | ICD-10-CM | POA: Diagnosis not present

## 2020-04-18 DIAGNOSIS — Z87891 Personal history of nicotine dependence: Secondary | ICD-10-CM | POA: Insufficient documentation

## 2020-04-18 DIAGNOSIS — Z9013 Acquired absence of bilateral breasts and nipples: Secondary | ICD-10-CM | POA: Insufficient documentation

## 2020-04-18 DIAGNOSIS — Z79899 Other long term (current) drug therapy: Secondary | ICD-10-CM | POA: Insufficient documentation

## 2020-04-18 DIAGNOSIS — L309 Dermatitis, unspecified: Secondary | ICD-10-CM

## 2020-04-18 DIAGNOSIS — Z189 Retained foreign body fragments, unspecified material: Secondary | ICD-10-CM

## 2020-04-18 DIAGNOSIS — Z1889 Other specified retained foreign body fragments: Secondary | ICD-10-CM | POA: Diagnosis present

## 2020-04-18 HISTORY — PX: FOREIGN BODY REMOVAL: SHX962

## 2020-04-18 SURGERY — FOREIGN BODY REMOVAL ADULT
Anesthesia: General

## 2020-04-18 MED ORDER — LACTATED RINGERS IV SOLN: INTRAVENOUS | Status: DC | PRN

## 2020-04-18 MED ORDER — CHLORHEXIDINE GLUCONATE 0.12 % MT SOLN
OROMUCOSAL | Status: AC
  Administered 2020-04-18: 15 mL via OROMUCOSAL
  Filled 2020-04-18: qty 15

## 2020-04-18 MED ORDER — FENTANYL CITRATE (PF) 100 MCG/2ML IJ SOLN
INTRAMUSCULAR | Status: DC | PRN
  Administered 2020-04-18: 50 ug via INTRAVENOUS

## 2020-04-18 MED ORDER — BUPIVACAINE LIPOSOME 1.3 % IJ SUSP: 20.0000 mL | Freq: Once | INTRAMUSCULAR | Status: DC

## 2020-04-18 MED ORDER — LIDOCAINE HCL (CARDIAC) PF 100 MG/5ML IV SOSY
PREFILLED_SYRINGE | INTRAVENOUS | Status: DC | PRN
  Administered 2020-04-18: 100 mg via INTRAVENOUS

## 2020-04-18 MED ORDER — ORAL CARE MOUTH RINSE: 15.0000 mL | Freq: Once | OROMUCOSAL | Status: AC

## 2020-04-18 MED ORDER — MIDAZOLAM HCL 2 MG/2ML IJ SOLN
INTRAMUSCULAR | Status: AC
  Filled 2020-04-18: qty 2

## 2020-04-18 MED ORDER — ACETAMINOPHEN 500 MG PO TABS
ORAL_TABLET | ORAL | Status: AC
  Administered 2020-04-18: 1000 mg via ORAL
  Filled 2020-04-18: qty 2

## 2020-04-18 MED ORDER — CHLORHEXIDINE GLUCONATE 0.12 % MT SOLN: 15.0000 mL | Freq: Once | OROMUCOSAL | Status: AC

## 2020-04-18 MED ORDER — CEFAZOLIN SODIUM-DEXTROSE 2-4 GM/100ML-% IV SOLN
2.0000 g | INTRAVENOUS | Status: AC
  Administered 2020-04-18: 2 g via INTRAVENOUS

## 2020-04-18 MED ORDER — GABAPENTIN 300 MG PO CAPS
ORAL_CAPSULE | ORAL | Status: AC
  Administered 2020-04-18: 300 mg via ORAL
  Filled 2020-04-18: qty 1

## 2020-04-18 MED ORDER — BACITRACIN ZINC 500 UNIT/GM EX OINT
TOPICAL_OINTMENT | CUTANEOUS | Status: DC | PRN
  Administered 2020-04-18: 1 via TOPICAL

## 2020-04-18 MED ORDER — LACTATED RINGERS IV SOLN: INTRAVENOUS | Status: DC

## 2020-04-18 MED ORDER — CHLORHEXIDINE GLUCONATE CLOTH 2 % EX PADS: 6.0000 | MEDICATED_PAD | Freq: Once | CUTANEOUS | Status: DC

## 2020-04-18 MED ORDER — PROPOFOL 10 MG/ML IV BOLUS
INTRAVENOUS | Status: DC | PRN
  Administered 2020-04-18: 150 mg via INTRAVENOUS
  Administered 2020-04-18: 50 mg via INTRAVENOUS

## 2020-04-18 MED ORDER — NEOSPORIN PLUS PAIN RELIEF MS 3.5-10000-10 EX CREA: TOPICAL_CREAM | Freq: Every day | CUTANEOUS | 0 refills | Status: AC

## 2020-04-18 MED ORDER — CEFAZOLIN SODIUM-DEXTROSE 2-4 GM/100ML-% IV SOLN
INTRAVENOUS | Status: AC
  Filled 2020-04-18: qty 100

## 2020-04-18 MED ORDER — FENTANYL CITRATE (PF) 100 MCG/2ML IJ SOLN
INTRAMUSCULAR | Status: AC
  Filled 2020-04-18: qty 2

## 2020-04-18 MED ORDER — LIDOCAINE-EPINEPHRINE 1 %-1:100000 IJ SOLN
INTRAMUSCULAR | Status: AC
  Filled 2020-04-18: qty 1

## 2020-04-18 MED ORDER — MIDAZOLAM HCL 2 MG/2ML IJ SOLN
INTRAMUSCULAR | Status: DC | PRN
  Administered 2020-04-18: 2 mg via INTRAVENOUS

## 2020-04-18 MED ORDER — BUPIVACAINE HCL (PF) 0.25 % IJ SOLN
INTRAMUSCULAR | Status: AC
  Filled 2020-04-18: qty 30

## 2020-04-18 MED ORDER — PROPOFOL 10 MG/ML IV BOLUS
INTRAVENOUS | Status: AC
  Filled 2020-04-18: qty 20

## 2020-04-18 MED ORDER — ONDANSETRON HCL 4 MG/2ML IJ SOLN
INTRAMUSCULAR | Status: DC | PRN
  Administered 2020-04-18: 4 mg via INTRAVENOUS

## 2020-04-18 MED ORDER — CELECOXIB 200 MG PO CAPS: 200.0000 mg | ORAL_CAPSULE | ORAL | Status: AC

## 2020-04-18 MED ORDER — DEXAMETHASONE SODIUM PHOSPHATE 10 MG/ML IJ SOLN
INTRAMUSCULAR | Status: DC | PRN
  Administered 2020-04-18: 10 mg via INTRAVENOUS

## 2020-04-18 MED ORDER — BACITRACIN ZINC 500 UNIT/GM EX OINT
TOPICAL_OINTMENT | CUTANEOUS | Status: AC
  Filled 2020-04-18: qty 28.35

## 2020-04-18 MED ORDER — GABAPENTIN 300 MG PO CAPS: 300.0000 mg | ORAL_CAPSULE | ORAL | Status: AC

## 2020-04-18 MED ORDER — ONDANSETRON HCL 4 MG/2ML IJ SOLN
INTRAMUSCULAR | Status: AC
  Filled 2020-04-18: qty 2

## 2020-04-18 MED ORDER — CELECOXIB 200 MG PO CAPS
ORAL_CAPSULE | ORAL | Status: AC
  Administered 2020-04-18: 200 mg via ORAL
  Filled 2020-04-18: qty 1

## 2020-04-18 MED ORDER — ACETAMINOPHEN 500 MG PO TABS: 1000.0000 mg | ORAL_TABLET | ORAL | Status: AC

## 2020-04-18 SURGICAL SUPPLY — 38 items
ADH SKN CLS APL DERMABOND .7 (GAUZE/BANDAGES/DRESSINGS) ×1
APL PRP STRL LF DISP 70% ISPRP (MISCELLANEOUS) ×1
BACTOSHIELD CHG 4% 4OZ (MISCELLANEOUS)
BLADE SURG 15 STRL LF DISP TIS (BLADE) ×1 IMPLANT
BLADE SURG 15 STRL SS (BLADE) ×2
CANISTER SUCT 1200ML W/VALVE (MISCELLANEOUS) IMPLANT
CHLORAPREP W/TINT 26 (MISCELLANEOUS) ×2 IMPLANT
COVER WAND RF STERILE (DRAPES) IMPLANT
DECANTER SPIKE VIAL GLASS SM (MISCELLANEOUS) ×2 IMPLANT
DERMABOND ADVANCED (GAUZE/BANDAGES/DRESSINGS) ×1
DERMABOND ADVANCED .7 DNX12 (GAUZE/BANDAGES/DRESSINGS) ×1 IMPLANT
DRAPE LAPAROTOMY 77X122 PED (DRAPES) IMPLANT
DRAPE MAG INST 16X20 L/F (DRAPES) IMPLANT
DRSG TEGADERM 4X4.75 (GAUZE/BANDAGES/DRESSINGS) ×2 IMPLANT
ELECT CAUTERY BLADE TIP 2.5 (TIP) ×2
ELECT REM PT RETURN 9FT ADLT (ELECTROSURGICAL) ×2
ELECTRODE CAUTERY BLDE TIP 2.5 (TIP) ×1 IMPLANT
ELECTRODE REM PT RTRN 9FT ADLT (ELECTROSURGICAL) ×1 IMPLANT
GAUZE SPONGE 4X4 12PLY STRL (GAUZE/BANDAGES/DRESSINGS) ×2 IMPLANT
GLOVE INDICATOR 7.0 STRL GRN (GLOVE) ×2 IMPLANT
GLOVE SURG ENC MOIS LTX SZ6.5 (GLOVE) ×2 IMPLANT
GOWN STRL REUS W/ TWL LRG LVL3 (GOWN DISPOSABLE) ×2 IMPLANT
GOWN STRL REUS W/TWL LRG LVL3 (GOWN DISPOSABLE) ×4
MANIFOLD NEPTUNE II (INSTRUMENTS) ×2 IMPLANT
NEEDLE HYPO 25X1 1.5 SAFETY (NEEDLE) ×2 IMPLANT
NS IRRIG 500ML POUR BTL (IV SOLUTION) ×2 IMPLANT
PACK BASIN MINOR ARMC (MISCELLANEOUS) ×2 IMPLANT
SCRUB CHG 4% DYNA-HEX 4OZ (MISCELLANEOUS) IMPLANT
SPONGE LAP 18X18 RF (DISPOSABLE) ×2 IMPLANT
STRIP CLOSURE SKIN 1/2X4 (GAUZE/BANDAGES/DRESSINGS) ×2 IMPLANT
SUT MNCRL 4-0 (SUTURE)
SUT MNCRL 4-0 27XMFL (SUTURE)
SUT VIC AB 3-0 SH 27 (SUTURE)
SUT VIC AB 3-0 SH 27X BRD (SUTURE) IMPLANT
SUTURE MNCRL 4-0 27XMF (SUTURE) IMPLANT
SWAB CULTURE AMIES ANAERIB BLU (MISCELLANEOUS) ×2 IMPLANT
SYR 10ML LL (SYRINGE) ×2 IMPLANT
SYR BULB IRRIG 60ML STRL (SYRINGE) ×2 IMPLANT

## 2020-04-18 NOTE — Transfer of Care (Signed)
Immediate Anesthesia Transfer of Care Note  Patient: Jacqueline Bowman  Procedure(s) Performed: FOREIGN BODY REMOVAL ADULT, exploration of umbilicus (N/A )  Patient Location: PACU  Anesthesia Type:General  Level of Consciousness: awake, alert  and oriented  Airway & Oxygen Therapy: Patient Spontanous Breathing and Patient connected to face mask oxygen  Post-op Assessment: Report given to RN and Post -op Vital signs reviewed and stable  Post vital signs: Reviewed and stable  Last Vitals:  Vitals Value Taken Time  BP 115/66 04/18/20 1411  Temp    Pulse 50 04/18/20 1413  Resp 11 04/18/20 1413  SpO2 100 % 04/18/20 1413  Vitals shown include unvalidated device data.  Last Pain: There were no vitals filed for this visit.       Complications: No complications documented.

## 2020-04-18 NOTE — Anesthesia Postprocedure Evaluation (Signed)
Anesthesia Post Note  Patient: Jacqueline Bowman  Procedure(s) Performed: FOREIGN BODY REMOVAL ADULT, exploration of umbilicus (N/A )  Patient location during evaluation: PACU Anesthesia Type: General Level of consciousness: awake and alert Pain management: pain level controlled Vital Signs Assessment: post-procedure vital signs reviewed and stable Respiratory status: spontaneous breathing, nonlabored ventilation, respiratory function stable and patient connected to nasal cannula oxygen Cardiovascular status: blood pressure returned to baseline and stable Postop Assessment: no apparent nausea or vomiting Anesthetic complications: no   No complications documented.   Last Vitals:  Vitals:   04/18/20 1430 04/18/20 1445  BP: 109/73 115/74  Pulse: (!) 51 (!) 50  Resp: 11 12  Temp:    SpO2: 100% 100%    Last Pain:  Vitals:   04/18/20 1445  PainSc: [place]

## 2020-04-18 NOTE — Interval H&P Note (Signed)
History and Physical Interval Note:  04/18/2020 1:26 PM  Jacqueline Bowman  has presented today for surgery, with the diagnosis of possible foreign body.  The various methods of treatment have been discussed with the patient and family. After consideration of risks, benefits and other options for treatment, the patient has consented to  Procedure(s): FOREIGN BODY REMOVAL ADULT, exploration of umbilicus (N/A) as a surgical intervention.  The patient's history has been reviewed, patient examined, no change in status, stable for surgery.  I have reviewed the patient's chart and labs.  Questions were answered to the patient's satisfaction.     Fredirick Maudlin

## 2020-04-18 NOTE — Op Note (Signed)
Operative Note  Preoperative Diagnosis: Retained umbilical foreign body  Postoperative Diagnosis: Same, no foreign body identified  Operation: Exploration of umbilicus for retained foreign body under anesthesia  Surgeon: Fredirick Maudlin, MD  Assistant: None  Anesthesia: General, via LMA  Findings: Excoriated skin consistent with prior manipulation, no foreign body identified.  No sinus or fistula tract.  Indications: This is a 55 year old woman who underwent bilateral mastectomy with primary tissue reconstruction roughly 20 years ago.  She gave a history of purulent drainage from her umbilicus since that time.  She says that she frequently cleans it out with a Q-tip, but last week, after doing so, she removed the Q-tip and the cotton portion appeared to have been retained within her umbilicus.  Her primary care provider was concerned that she may have a sinus or fistula tract and referred her to general surgery.  The patient was unable to tolerate an examination and exploration in clinic and therefore was brought to the operating room today so that a more thorough evaluation could be performed under anesthesia.  Procedure In Detail: The patient was identified in the preoperative holding area and brought to the operating room where she was placed supine on the OR table.  Bony prominences were padded and bilateral sequential compression devices were placed on the lower extremities.  General anesthesia via laryngeal mask airway was induced without complications.  The patient was positioned appropriately for the operation and sterilely prepped and draped in standard fashion.  A timeout was performed confirming her identity, the procedure being performed, her allergies, all necessary equipment was available, and that maintenance anesthesia was adequate.  A Kocher clamp was used to grasp the base of the umbilicus which was then everted.  The tissue and folds surrounding the base of the umbilicus were  thoroughly evaluated.  No foreign body was appreciated.  No sinus tract or fistula was identified.  The skin was noted to be excoriated and abraded.  A layer of bacitracin ointment was applied and the bellybutton covered with gauze and Tegaderm.  The patient was then awakened from her anesthesia, the LMA removed, and she was taken to the postanesthesia recovery unit in stable condition.  EBL: None  IVF: Anesthesia record  Specimen(s): None  Complications: none immediately apparent.   Counts: all needles, instruments, and sponges were counted and reported to be correct in number at the end of the case.   I was present for and participated in the entire operation.  Fredirick Maudlin 2:15 PM

## 2020-04-18 NOTE — Anesthesia Preprocedure Evaluation (Signed)
Anesthesia Evaluation  Patient identified by MRN, date of birth, ID band Patient awake    Reviewed: Allergy & Precautions, H&P , NPO status , Patient's Chart, lab work & pertinent test results  History of Anesthesia Complications Negative for: history of anesthetic complications  Airway Mallampati: II  TM Distance: >3 FB Neck ROM: full    Dental  (+) Chipped, Poor Dentition, Missing, Partial Upper   Pulmonary neg pulmonary ROS, neg shortness of breath, former smoker,    Pulmonary exam normal        Cardiovascular Exercise Tolerance: Good hypertension, (-) angina(-) Past MI and (-) DOE Normal cardiovascular exam     Neuro/Psych negative neurological ROS  negative psych ROS   GI/Hepatic negative GI ROS, Neg liver ROS,   Endo/Other  negative endocrine ROS  Renal/GU      Musculoskeletal   Abdominal   Peds  Hematology negative hematology ROS (+)   Anesthesia Other Findings Past Medical History: No date: Breast cancer [facility])     Comment:  January 2000 left No date: Hypertension  Past Surgical History: 2004: ABDOMINAL HYSTERECTOMY No date: BREAST SURGERY 2018: FRACTURE SURGERY     Comment:  right foot to correct knee 2004: MASTECTOMY; Bilateral 2018: TOTAL KNEE ARTHROPLASTY; Bilateral     Comment:  2018 left 2019 right     Reproductive/Obstetrics negative OB ROS                             Anesthesia Physical Anesthesia Plan  ASA: III  Anesthesia Plan: General LMA   Post-op Pain Management:    Induction: Intravenous  PONV Risk Score and Plan: Dexamethasone, Ondansetron, Midazolam and Treatment may vary due to age or medical condition  Airway Management Planned: Natural Airway and Nasal Cannula  Additional Equipment:   Intra-op Plan:   Post-operative Plan:   Informed Consent: I have reviewed the patients History and Physical, chart, labs and discussed the procedure  including the risks, benefits and alternatives for the proposed anesthesia with the patient or authorized representative who has indicated his/her understanding and acceptance.     Dental Advisory Given  Plan Discussed with: Anesthesiologist, CRNA and Surgeon  Anesthesia Plan Comments: (Patient consented for risks of anesthesia including but not limited to:  - adverse reactions to medications - risk of airway placement if required - damage to eyes, teeth, lips or other oral mucosa - nerve damage due to positioning  - sore throat or hoarseness - Damage to heart, brain, nerves, lungs, other parts of body or loss of life  Patient voiced understanding.)        Anesthesia Quick Evaluation

## 2020-04-18 NOTE — Discharge Instructions (Signed)

## 2020-04-19 ENCOUNTER — Encounter: Payer: Self-pay | Admitting: General Surgery

## 2020-04-24 ENCOUNTER — Ambulatory Visit (INDEPENDENT_AMBULATORY_CARE_PROVIDER_SITE_OTHER): Payer: Medicare Other | Admitting: General Surgery

## 2020-04-24 ENCOUNTER — Other Ambulatory Visit: Payer: Self-pay

## 2020-04-24 VITALS — BP 111/73 | HR 68 | Temp 98.2°F | Wt 171.8 lb

## 2020-04-24 DIAGNOSIS — Z189 Retained foreign body fragments, unspecified material: Secondary | ICD-10-CM

## 2020-04-24 NOTE — Patient Instructions (Signed)
Please contact us if you have any questions or concerns.

## 2020-04-24 NOTE — Progress Notes (Signed)
Jacqueline Bowman is here today for postop follow-up.  She presented to me last week with concern that she had a foreign body in her umbilicus.  She reported that ever since she had primary tissue reconstruction for bilateral mastectomy, she has had drainage from her umbilicus.  She says that she sometimes takes a Q-tip into her bellybutton and pulls out thick white material that sometimes smells bad.  She recently did this and felt like the cotton part of the Q-tip was retained.  She was unable to tolerate exploration in the office and therefore we took her to the operating room last Friday.  I was able to completely evert her umbilicus to the base and found no foreign object.  There was also no sinus or fistula tract.  Today she reports that she is feeling a little bit sore, but has otherwise been doing fine since surgery.  Today's Vitals   04/24/20 0953  BP: 111/73  Pulse: 68  Temp: 98.2 F (36.8 C)  TempSrc: Oral  SpO2: 99%  Weight: 171 lb 12.8 oz (77.9 kg)   Body mass index is 28.59 kg/m. Focused examination of the umbilicus demonstrates that there is no ongoing bruising or swelling.  No drainage.  Impression and plan: This is a 55 year old woman who had primary tissue reconstruction for breast cancer over 20 years ago.  She has been periodically digging material out of her umbilicus and there was concern that she could have a fistula or sinus.  The type of procedure that she had for her reconstruction does not involve an umbilical incision and I do not think there was ever any sinus or fistula present.  Certainly, one was not identified in the operating room last week.  In addition, there was no foreign body identified.  I think the material she had been removing periodically was simply retained dead skin cells, similar to the material found within a sebaceous cyst.  I have advised her not to probe her bellybutton with foreign objects in the future.  She should shower and clean the area thoroughly  with the tip of her finger.  I will see her on an as-needed basis.

## 2020-05-15 DIAGNOSIS — G894 Chronic pain syndrome: Secondary | ICD-10-CM | POA: Diagnosis not present

## 2020-05-15 DIAGNOSIS — G8918 Other acute postprocedural pain: Secondary | ICD-10-CM | POA: Diagnosis not present

## 2020-05-15 DIAGNOSIS — K5903 Drug induced constipation: Secondary | ICD-10-CM | POA: Diagnosis not present

## 2020-05-15 DIAGNOSIS — Z79899 Other long term (current) drug therapy: Secondary | ICD-10-CM | POA: Diagnosis not present

## 2020-05-15 DIAGNOSIS — Z96653 Presence of artificial knee joint, bilateral: Secondary | ICD-10-CM | POA: Diagnosis not present

## 2020-05-15 DIAGNOSIS — K5900 Constipation, unspecified: Secondary | ICD-10-CM | POA: Diagnosis not present

## 2020-05-15 DIAGNOSIS — M1711 Unilateral primary osteoarthritis, right knee: Secondary | ICD-10-CM | POA: Diagnosis not present

## 2020-05-15 DIAGNOSIS — S9031XA Contusion of right foot, initial encounter: Secondary | ICD-10-CM | POA: Diagnosis not present

## 2020-05-15 DIAGNOSIS — M1991 Primary osteoarthritis, unspecified site: Secondary | ICD-10-CM | POA: Diagnosis not present

## 2020-05-21 DIAGNOSIS — R922 Inconclusive mammogram: Secondary | ICD-10-CM | POA: Diagnosis not present

## 2020-05-21 DIAGNOSIS — Z853 Personal history of malignant neoplasm of breast: Secondary | ICD-10-CM | POA: Diagnosis not present

## 2020-05-21 DIAGNOSIS — R590 Localized enlarged lymph nodes: Secondary | ICD-10-CM | POA: Diagnosis not present

## 2020-05-21 LAB — HM MAMMOGRAPHY

## 2020-05-26 ENCOUNTER — Encounter: Payer: Self-pay | Admitting: Family Medicine

## 2020-07-24 ENCOUNTER — Encounter: Payer: Self-pay | Admitting: Family Medicine

## 2020-07-24 ENCOUNTER — Ambulatory Visit (INDEPENDENT_AMBULATORY_CARE_PROVIDER_SITE_OTHER): Payer: Medicare Other | Admitting: Family Medicine

## 2020-07-24 ENCOUNTER — Other Ambulatory Visit: Payer: Self-pay

## 2020-07-24 VITALS — BP 118/76 | HR 82 | Temp 98.0°F | Ht 65.0 in | Wt 172.8 lb

## 2020-07-24 DIAGNOSIS — Z862 Personal history of diseases of the blood and blood-forming organs and certain disorders involving the immune mechanism: Secondary | ICD-10-CM | POA: Diagnosis not present

## 2020-07-24 DIAGNOSIS — Z1159 Encounter for screening for other viral diseases: Secondary | ICD-10-CM | POA: Diagnosis not present

## 2020-07-24 DIAGNOSIS — Z0000 Encounter for general adult medical examination without abnormal findings: Secondary | ICD-10-CM | POA: Diagnosis not present

## 2020-07-24 DIAGNOSIS — E663 Overweight: Secondary | ICD-10-CM

## 2020-07-24 DIAGNOSIS — I10 Essential (primary) hypertension: Secondary | ICD-10-CM | POA: Diagnosis not present

## 2020-07-24 DIAGNOSIS — Z114 Encounter for screening for human immunodeficiency virus [HIV]: Secondary | ICD-10-CM

## 2020-07-24 DIAGNOSIS — R300 Dysuria: Secondary | ICD-10-CM | POA: Diagnosis not present

## 2020-07-24 LAB — POCT URINALYSIS DIPSTICK
Bilirubin, UA: NEGATIVE
Blood, UA: NEGATIVE
Glucose, UA: NEGATIVE
Ketones, UA: NEGATIVE
Leukocytes, UA: NEGATIVE
Nitrite, UA: NEGATIVE
Protein, UA: NEGATIVE
Spec Grav, UA: 1.025 (ref 1.010–1.025)
Urobilinogen, UA: 0.2 E.U./dL
pH, UA: 6 (ref 5.0–8.0)

## 2020-07-24 NOTE — Assessment & Plan Note (Signed)
Discussed importance of healthy weight management Discussed diet and exercise  

## 2020-07-24 NOTE — Progress Notes (Signed)
Complete physical exam   Patient: Jacqueline Bowman   DOB: 1965/03/26   55 y.o. Female  MRN: [id number] Visit Date: 07/24/2020  Today's healthcare provider: Lavon Paganini, MD   Chief Complaint  Patient presents with  . Annual Exam   Subjective    Jacqueline Bowman is a 55 y.o. female who presents today for a complete physical exam.  She reports consuming a low carb diet. Home exercise routine includes walking. She generally feels fairly well. She reports sleeping fairly well. She does not have additional problems to discuss today.  HPI  She reports that she is doing well.   Mammogram She says her mammogram results came back clear in March 2022 due to her history of breast cancer. She has had a colonoscopy around the age of 18. [place] she had a team of oncologist because her cancer was aggressive when she had it.   Lab Work At [facility] clinic she had her papsmear completed 1 year ago. Also she had lab work done previously. She does have a history of anemia.   Urinalysis  She is experiencing pressure after urination and she is requesting for a urinalysis to determine if she has a UTI. She has a history of frequent urination and issue with bowel movements due to her medications.   Medications  She is no longer taking Flonase. She is tolerating 50mg  of Topamax and Lyrica.  Lifestyle She had been monitoring her weight. She had increased her exercise in moderation due to her double knee replacement. She limits her soda and carbs   Vaccine She has not received her second COVID vaccine. She denies having the shingles vaccine.   Past Medical History:  Diagnosis Date  . Breast cancer [facility])    January 2000 left  . Hypertension    Past Surgical History:  Procedure Laterality Date  . ABDOMINAL HYSTERECTOMY  2004  . BREAST SURGERY    . FOREIGN BODY REMOVAL N/A 04/18/2020   Procedure: FOREIGN BODY REMOVAL ADULT, exploration of umbilicus;  Surgeon: Fredirick Maudlin, MD;  Location: [facility] ORS;  Service: General;  Laterality: N/A;  . FRACTURE SURGERY  2018   right foot to correct knee  . MASTECTOMY Bilateral 2004  . TOTAL KNEE ARTHROPLASTY Bilateral 2018   2018 left 2019 right   Social History   Socioeconomic History  . Marital status: Legally Separated    Spouse name: Not on file  . Number of children: 1  . Years of education: Not on file  . Highest education level: Not on file  Occupational History  . Occupation: disabled  Tobacco Use  . Smoking status: Former Smoker    Types: Cigarettes  . Smokeless tobacco: Never Used  . Tobacco comment: socailly smoking for a few years in the distant past  Vaping Use  . Vaping Use: Never used  Substance and Sexual Activity  . Alcohol use: Not Currently    Comment: socially  . Drug use: No  . Sexual activity: Not Currently  Other Topics Concern  . Not on file  Social History Narrative  . Not on file   Social Determinants of Health   Financial Resource Strain: Not on file  Food Insecurity: Not on file  Transportation Needs: Not on file  Physical Activity: Not on file  Stress: Not on file  Social Connections: Not on file  Intimate Partner Violence: Not on file   Family Status  Relation Name Status  . Mother  Deceased at age 66       breast cancer  . Father  Deceased  . Sister  Alive  . Brother  Alive  . Son  Alive  . Brother  Alive   Family History  Problem Relation Age of Onset  . Breast cancer Mother 44  . Hypertension Father   . Hyperlipidemia Father   . Obesity Father   . Colon cancer Brother 33  . Heart disease Brother    No Known Allergies  Patient Care Team: Mahmoud Blazejewski, Dionne Bucy, MD as PCP - General (Family Medicine) Rico Junker, RN as Registered Nurse Theodore Demark, RN as Registered Nurse   Medications: Outpatient Medications Prior to Visit  Medication Sig  . cetirizine (ZYRTEC) 10 MG tablet Take 1 tablet (10 mg total) by mouth daily.  . diclofenac  Sodium (VOLTAREN) 1 % GEL Apply 1 application topically daily as needed (pain).  . Multiple Vitamins-Minerals (MULTIVITAMIN WITH MINERALS) tablet Take 1 tablet by mouth daily. Complete  . oxyCODONE-acetaminophen (PERCOCET) 10-325 MG tablet Take 1 tablet by mouth every 4 (four) hours as needed for pain.  . pregabalin (LYRICA) 50 MG capsule Take 50 mg by mouth daily.  Marland Kitchen topiramate (TOPAMAX) 50 MG tablet Take 50 mg by mouth daily.  . [DISCONTINUED] fluticasone (FLONASE) 50 MCG/ACT nasal spray Place 2 sprays into both nostrils daily. (Patient taking differently: Place 1 spray into both nostrils daily.)   No facility-administered medications prior to visit.    Review of Systems  Constitutional: Negative.  Negative for chills, fatigue and fever.  HENT: Negative.  Negative for ear pain, rhinorrhea, sinus pressure, sinus pain and sore throat.   Eyes: Negative.  Negative for pain.  Respiratory: Negative.  Negative for cough, chest tightness, shortness of breath and wheezing.   Cardiovascular: Negative.  Negative for chest pain, palpitations and leg swelling.  Gastrointestinal: Negative.  Negative for abdominal pain, blood in stool, constipation, nausea and vomiting.  Endocrine: Positive for polyuria (increased pressure).  Genitourinary: Negative.  Negative for dysuria, flank pain, frequency, pelvic pain, urgency and vaginal pain.  Musculoskeletal: Negative.  Negative for back pain, myalgias, neck pain and neck stiffness.  Skin: Negative.   Allergic/Immunologic: Positive for environmental allergies.  Neurological: Negative.  Negative for dizziness, syncope, weakness, light-headedness, numbness and headaches.  Hematological: Negative.   Psychiatric/Behavioral: Negative.       Objective    BP 118/76 (BP Location: Left Arm, Patient Position: Sitting, Cuff Size: Normal)   Pulse 82   Temp 98 F (36.7 C) (Oral)   Ht 5\' 5"  (1.651 m)   Wt 172 lb 12.8 oz (78.4 kg)   SpO2 99%   BMI 28.76 kg/m     Physical Exam Vitals reviewed.  Constitutional:      General: She is not in acute distress.    Appearance: Normal appearance. She is well-developed. She is not diaphoretic.  HENT:     Head: Normocephalic and atraumatic.     Right Ear: Tympanic membrane, ear canal and external ear normal.     Left Ear: Tympanic membrane, ear canal and external ear normal.     Nose: Nose normal.     Mouth/Throat:     Mouth: Mucous membranes are moist.     Pharynx: Oropharynx is clear. No oropharyngeal exudate.  Eyes:     General: No scleral icterus.    Conjunctiva/sclera: Conjunctivae normal.     Pupils: Pupils are equal, round, and reactive to light.  Neck:  Thyroid: No thyromegaly.  Cardiovascular:     Rate and Rhythm: Normal rate and regular rhythm.     Pulses: Normal pulses.     Heart sounds: Normal heart sounds. No murmur heard.   Pulmonary:     Effort: Pulmonary effort is normal. No respiratory distress.     Breath sounds: Normal breath sounds. No wheezing or rales.  Abdominal:     General: There is no distension.     Palpations: Abdomen is soft.     Tenderness: There is no abdominal tenderness.  Musculoskeletal:        General: No deformity.     Cervical back: Neck supple.     Right lower leg: No edema.     Left lower leg: No edema.  Lymphadenopathy:     Cervical: No cervical adenopathy.  Skin:    General: Skin is warm and dry.     Findings: No rash.  Neurological:     Mental Status: She is alert and oriented to person, place, and time. Mental status is at baseline.     Sensory: No sensory deficit.     Motor: No weakness.     Gait: Gait normal.  Psychiatric:        Mood and Affect: Mood normal.        Behavior: Behavior normal.        Thought Content: Thought content normal.      Last depression screening scores PHQ 2/9 Scores 07/24/2020 04/14/2020  PHQ - 2 Score 1 0  PHQ- 9 Score 4 -   Last fall risk screening Fall Risk  07/24/2020  Falls in the past year? 1   Number falls in past yr: 0  Injury with Fall? 1  Risk for fall due to : History of fall(s)  Follow up Falls evaluation completed;Education provided;Falls prevention discussed   Last Audit-C alcohol use screening Alcohol Use Disorder Test (AUDIT) 07/24/2020  1. How often do you have a drink containing alcohol? 0  2. How many drinks containing alcohol do you have on a typical day when you are drinking? 0  3. How often do you have six or more drinks on one occasion? 0  AUDIT-C Score 0   A score of 3 or more in women, and 4 or more in men indicates increased risk for alcohol abuse, EXCEPT if all of the points are from question 1   Results for orders placed or performed in visit on 07/24/20  POCT urinalysis dipstick  Result Value Ref Range   Color, UA Yellow    Clarity, UA Clear    Glucose, UA Negative Negative   Bilirubin, UA Negative    Ketones, UA Negative    Spec Grav, UA 1.025 1.010 - 1.025   Blood, UA Negative    pH, UA 6.0 5.0 - 8.0   Protein, UA Negative Negative   Urobilinogen, UA 0.2 0.2 or 1.0 E.U./dL   Nitrite, UA Negative    Leukocytes, UA Negative Negative   Appearance     Odor      Assessment & Plan     Problem List Items Addressed This Visit      Cardiovascular and Mediastinum   Primary hypertension    Well controlled Diet controlled Recheck metabolic panel F/u in 6 months         Other   Overweight    Discussed importance of healthy weight management Discussed diet and exercise        Other Visit Diagnoses  Annual physical exam    -  Primary   Relevant Orders   HIV Antibody (routine testing w rflx)   Lipid panel   Comprehensive metabolic panel   CBC with Differential/Platelet   Need for hepatitis C screening test       Relevant Orders   Hepatitis C antibody   Encounter for screening for HIV       History of anemia       Dysuria       Relevant Orders   POCT urinalysis dipstick (Completed)    - negative UA   Routine Health  Maintenance and Physical Exam  Exercise Activities and Dietary recommendations Goals   None     Immunization History  Administered Date(s) Administered  . Moderna Sars-Covid-2 Vaccination 05/31/2019, 07/04/2019, 12/14/2019  . Tdap 05/15/2010    Health Maintenance  Topic Date Due  . Hepatitis C Screening  Never done  . PAP SMEAR-Modifier  Never done  . COLONOSCOPY (Pts 45-12yrs Insurance coverage will need to be confirmed)  Never done  . TETANUS/TDAP  05/15/2020  . COVID-19 Vaccine (4 - Booster for Moderna series) 06/12/2020  . INFLUENZA VACCINE  10/20/2020  . MAMMOGRAM  05/22/2022  . HIV Screening  Completed  . HPV VACCINES  Aged Out    Discussed health benefits of physical activity, and encouraged her to engage in regular exercise appropriate for her age and condition.    Return in about 1 year (around 07/24/2021) for CPE.     I,Essence Turner,acting as a Education administrator for Lavon Paganini, MD.,have documented all relevant documentation on the behalf of Lavon Paganini, MD,as directed by  Lavon Paganini, MD while in the presence of Lavon Paganini, MD.  I, Lavon Paganini, MD, have reviewed all documentation for this visit. The documentation on 07/24/20 for the exam, diagnosis, procedures, and orders are all accurate and complete.   Loras Grieshop, Dionne Bucy, MD, MPH [facility]

## 2020-07-24 NOTE — Assessment & Plan Note (Signed)
Well controlled Diet controlled Recheck metabolic panel F/u in 6 months

## 2020-07-24 NOTE — Patient Instructions (Signed)
The CDC recommends two doses of Shingrix (the shingles vaccine) separated by 2 to 6 months for adults age 57 years and older. I recommend checking with your insurance plan regarding coverage for this vaccine.     Also talk to pharmacy about tetanus vaccine and covid booster   Preventive Care 19-62 Years Old, Female Preventive care refers to lifestyle choices and visits with your health care provider that can promote health and wellness. This includes:  A yearly physical exam. This is also called an annual wellness visit.  Regular dental and eye exams.  Immunizations.  Screening for certain conditions.  Healthy lifestyle choices, such as: ? Eating a healthy diet. ? Getting regular exercise. ? Not using drugs or products that contain nicotine and tobacco. ? Limiting alcohol use. What can I expect for my preventive care visit? Physical exam Your health care provider will check your:  Height and weight. These may be used to calculate your BMI (body mass index). BMI is a measurement that tells if you are at a healthy weight.  Heart rate and blood pressure.  Body temperature.  Skin for abnormal spots. Counseling Your health care provider may ask you questions about your:  Past medical problems.  Family's medical history.  Alcohol, tobacco, and drug use.  Emotional well-being.  Home life and relationship well-being.  Sexual activity.  Diet, exercise, and sleep habits.  Work and work Statistician.  Access to firearms.  Method of birth control.  Menstrual cycle.  Pregnancy history. What immunizations do I need? Vaccines are usually given at various ages, according to a schedule. Your health care provider will recommend vaccines for you based on your age, medical history, and lifestyle or other factors, such as travel or where you work.   What tests do I need? Blood tests  Lipid and cholesterol levels. These may be checked every 5 years, or more often if you are  over 3 years old.  Hepatitis C test.  Hepatitis B test. Screening  Lung cancer screening. You may have this screening every year starting at age 68 if you have a 30-pack-year history of smoking and currently smoke or have quit within the past 15 years.  Colorectal cancer screening. ? All adults should have this screening starting at age 57 and continuing until age 63. ? Your health care provider may recommend screening at age 59 if you are at increased risk. ? You will have tests every 1-10 years, depending on your results and the type of screening test.  Diabetes screening. ? This is done by checking your blood sugar (glucose) after you have not eaten for a while (fasting). ? You may have this done every 1-3 years.  Mammogram. ? This may be done every 1-2 years. ? Talk with your health care provider about when you should start having regular mammograms. This may depend on whether you have a family history of breast cancer.  BRCA-related cancer screening. This may be done if you have a family history of breast, ovarian, tubal, or peritoneal cancers.  Pelvic exam and Pap test. ? This may be done every 3 years starting at age 32. ? Starting at age 22, this may be done every 5 years if you have a Pap test in combination with an HPV test. Other tests  STD (sexually transmitted disease) testing, if you are at risk.  Bone density scan. This is done to screen for osteoporosis. You may have this scan if you are at high risk for osteoporosis.  Talk with your health care provider about your test results, treatment options, and if necessary, the need for more tests. Follow these instructions at home: Eating and drinking  Eat a diet that includes fresh fruits and vegetables, whole grains, lean protein, and low-fat dairy products.  Take vitamin and mineral supplements as recommended by your health care provider.  Do not drink alcohol if: ? Your health care provider tells you not to  drink. ? You are pregnant, may be pregnant, or are planning to become pregnant.  If you drink alcohol: ? Limit how much you have to 0-1 drink a day. ? Be aware of how much alcohol is in your drink. In the [place] one drink equals one 12 oz bottle of beer (355 mL), one 5 oz glass of wine (148 mL), or one 1 oz glass of hard liquor (44 mL).   Lifestyle  Take daily care of your teeth and gums. Brush your teeth every morning and night with fluoride toothpaste. Floss one time each day.  Stay active. Exercise for at least 30 minutes 5 or more days each week.  Do not use any products that contain nicotine or tobacco, such as cigarettes, e-cigarettes, and chewing tobacco. If you need help quitting, ask your health care provider.  Do not use drugs.  If you are sexually active, practice safe sex. Use a condom or other form of protection to prevent STIs (sexually transmitted infections).  If you do not wish to become pregnant, use a form of birth control. If you plan to become pregnant, see your health care provider for a prepregnancy visit.  If told by your health care provider, take low-dose aspirin daily starting at age 5.  Find healthy ways to cope with stress, such as: ? Meditation, yoga, or listening to music. ? Journaling. ? Talking to a trusted person. ? Spending time with friends and family. Safety  Always wear your seat belt while driving or riding in a vehicle.  Do not drive: ? If you have been drinking alcohol. Do not ride with someone who has been drinking. ? When you are tired or distracted. ? While texting.  Wear a helmet and other protective equipment during sports activities.  If you have firearms in your house, make sure you follow all gun safety procedures. What's next?  Visit your health care provider once a year for an annual wellness visit.  Ask your health care provider how often you should have your eyes and teeth checked.  Stay up to date on all  vaccines. This information is not intended to replace advice given to you by your health care provider. Make sure you discuss any questions you have with your health care provider. Document Revised: 12/11/2019 Document Reviewed: 11/17/2017 Elsevier Patient Education  2021 [employer].

## 2020-07-25 ENCOUNTER — Telehealth: Payer: Self-pay

## 2020-07-25 LAB — CBC WITH DIFFERENTIAL/PLATELET
Basophils Absolute: 0 10*3/uL (ref 0.0–0.2)
Basos: 1 %
EOS (ABSOLUTE): 0 10*3/uL (ref 0.0–0.4)
Eos: 1 %
Hematocrit: 39.1 % (ref 34.0–46.6)
Hemoglobin: 12.2 g/dL (ref 11.1–15.9)
Immature Grans (Abs): 0 10*3/uL (ref 0.0–0.1)
Immature Granulocytes: 0 %
Lymphocytes Absolute: 2 10*3/uL (ref 0.7–3.1)
Lymphs: 37 %
MCH: 26.2 pg — ABNORMAL LOW (ref 26.6–33.0)
MCHC: 31.2 g/dL — ABNORMAL LOW (ref 31.5–35.7)
MCV: 84 fL (ref 79–97)
Monocytes Absolute: 0.3 10*3/uL (ref 0.1–0.9)
Monocytes: 6 %
Neutrophils Absolute: 3 10*3/uL (ref 1.4–7.0)
Neutrophils: 55 %
Platelets: 159 10*3/uL (ref 150–450)
RBC: 4.65 x10E6/uL (ref 3.77–5.28)
RDW: 15.2 % (ref 11.7–15.4)
WBC: 5.5 10*3/uL (ref 3.4–10.8)

## 2020-07-25 LAB — LIPID PANEL
Chol/HDL Ratio: 2.2 ratio (ref 0.0–4.4)
Cholesterol, Total: 254 mg/dL — ABNORMAL HIGH (ref 100–199)
HDL: 114 mg/dL (ref >39)
LDL Chol Calc (NIH): 129 mg/dL — ABNORMAL HIGH (ref 0–99)
Triglycerides: 69 mg/dL (ref 0–149)
VLDL Cholesterol Cal: 11 mg/dL (ref 5–40)

## 2020-07-25 LAB — COMPREHENSIVE METABOLIC PANEL
ALT: 13 IU/L (ref 0–32)
AST: 19 IU/L (ref 0–40)
Albumin/Globulin Ratio: 1.5 (ref 1.2–2.2)
Albumin: 4.4 g/dL (ref 3.8–4.9)
Alkaline Phosphatase: 112 IU/L (ref 44–121)
BUN/Creatinine Ratio: 19 (ref 9–23)
BUN: 17 mg/dL (ref 6–24)
Bilirubin Total: 0.2 mg/dL (ref 0.0–1.2)
CO2: 21 mmol/L (ref 20–29)
Calcium: 9.5 mg/dL (ref 8.7–10.2)
Chloride: 102 mmol/L (ref 96–106)
Creatinine, Ser: 0.9 mg/dL (ref 0.57–1.00)
Globulin, Total: 3 g/dL (ref 1.5–4.5)
Glucose: 80 mg/dL (ref 65–99)
Potassium: 4.1 mmol/L (ref 3.5–5.2)
Sodium: 139 mmol/L (ref 134–144)
Total Protein: 7.4 g/dL (ref 6.0–8.5)
eGFR: 75 mL/min/{1.73_m2} (ref >59)

## 2020-07-25 LAB — HIV ANTIBODY (ROUTINE TESTING W REFLEX): HIV Screen 4th Generation wRfx: NONREACTIVE

## 2020-07-25 LAB — HEPATITIS C ANTIBODY: Hep C Virus Ab: <0.1 s/co ratio (ref 0.0–0.9)

## 2020-07-25 NOTE — Telephone Encounter (Signed)
-----   Message from Virginia Crews, MD sent at 07/25/2020  8:10 AM EDT ----- Normal labs, except for slightly high cholesterol. I recommend diet low in saturated fat and regular exercise - 30 min at least 5 times per week

## 2020-07-25 NOTE — Telephone Encounter (Signed)
Patient was advised and verbalized understanding. 

## 2020-07-25 NOTE — Telephone Encounter (Signed)
Copied from [name] [id number]. Topic: Quick Communication - Lab Results (Clinic Use ONLY) >> Jul 25, 2020  1:15 PM Jacqueline Bowman wrote: Pt is calling and would like blood work results from 07-24-2020

## 2020-07-25 NOTE — Telephone Encounter (Signed)
See previous Telephone encounter.  Pt advised of lab results.

## 2020-08-26 ENCOUNTER — Other Ambulatory Visit: Payer: Self-pay | Admitting: Family Medicine

## 2020-08-26 DIAGNOSIS — H1131 Conjunctival hemorrhage, right eye: Secondary | ICD-10-CM | POA: Diagnosis not present

## 2020-09-08 DIAGNOSIS — G8918 Other acute postprocedural pain: Secondary | ICD-10-CM | POA: Diagnosis not present

## 2020-09-08 DIAGNOSIS — Z79891 Long term (current) use of opiate analgesic: Secondary | ICD-10-CM | POA: Diagnosis not present

## 2020-09-08 DIAGNOSIS — Z96659 Presence of unspecified artificial knee joint: Secondary | ICD-10-CM | POA: Diagnosis not present

## 2020-09-08 DIAGNOSIS — M1991 Primary osteoarthritis, unspecified site: Secondary | ICD-10-CM | POA: Diagnosis not present

## 2020-09-08 DIAGNOSIS — M19071 Primary osteoarthritis, right ankle and foot: Secondary | ICD-10-CM | POA: Diagnosis not present

## 2020-09-08 DIAGNOSIS — M1711 Unilateral primary osteoarthritis, right knee: Secondary | ICD-10-CM | POA: Diagnosis not present

## 2020-09-08 DIAGNOSIS — S9031XA Contusion of right foot, initial encounter: Secondary | ICD-10-CM | POA: Diagnosis not present

## 2020-09-08 DIAGNOSIS — G894 Chronic pain syndrome: Secondary | ICD-10-CM | POA: Diagnosis not present

## 2020-09-08 DIAGNOSIS — M19079 Primary osteoarthritis, unspecified ankle and foot: Secondary | ICD-10-CM | POA: Diagnosis not present

## 2020-09-08 DIAGNOSIS — Z5181 Encounter for therapeutic drug level monitoring: Secondary | ICD-10-CM | POA: Diagnosis not present

## 2020-09-08 DIAGNOSIS — R252 Cramp and spasm: Secondary | ICD-10-CM | POA: Diagnosis not present

## 2020-09-08 DIAGNOSIS — K5903 Drug induced constipation: Secondary | ICD-10-CM | POA: Diagnosis not present

## 2020-09-08 DIAGNOSIS — Z79899 Other long term (current) drug therapy: Secondary | ICD-10-CM | POA: Diagnosis not present

## 2020-09-25 DIAGNOSIS — M25561 Pain in right knee: Secondary | ICD-10-CM | POA: Diagnosis not present

## 2020-09-25 DIAGNOSIS — M25562 Pain in left knee: Secondary | ICD-10-CM | POA: Diagnosis not present

## 2020-10-15 DIAGNOSIS — Z1211 Encounter for screening for malignant neoplasm of colon: Secondary | ICD-10-CM | POA: Diagnosis not present

## 2020-10-15 LAB — FECAL OCCULT BLOOD, GUAIAC: Fecal Occult Blood: NEGATIVE

## 2020-11-08 DIAGNOSIS — K5900 Constipation, unspecified: Secondary | ICD-10-CM | POA: Diagnosis not present

## 2020-11-08 DIAGNOSIS — M1711 Unilateral primary osteoarthritis, right knee: Secondary | ICD-10-CM | POA: Diagnosis not present

## 2020-11-08 DIAGNOSIS — S9031XA Contusion of right foot, initial encounter: Secondary | ICD-10-CM | POA: Diagnosis not present

## 2020-11-08 DIAGNOSIS — G8918 Other acute postprocedural pain: Secondary | ICD-10-CM | POA: Diagnosis not present

## 2020-11-08 DIAGNOSIS — Z96659 Presence of unspecified artificial knee joint: Secondary | ICD-10-CM | POA: Diagnosis not present

## 2020-11-08 DIAGNOSIS — M1991 Primary osteoarthritis, unspecified site: Secondary | ICD-10-CM | POA: Diagnosis not present

## 2020-11-08 DIAGNOSIS — G894 Chronic pain syndrome: Secondary | ICD-10-CM | POA: Diagnosis not present

## 2020-11-08 DIAGNOSIS — K5903 Drug induced constipation: Secondary | ICD-10-CM | POA: Diagnosis not present

## 2020-11-08 DIAGNOSIS — Z79899 Other long term (current) drug therapy: Secondary | ICD-10-CM | POA: Diagnosis not present

## 2020-11-10 DIAGNOSIS — Z5181 Encounter for therapeutic drug level monitoring: Secondary | ICD-10-CM | POA: Diagnosis not present

## 2020-11-10 DIAGNOSIS — Z79899 Other long term (current) drug therapy: Secondary | ICD-10-CM | POA: Diagnosis not present

## 2020-12-11 DIAGNOSIS — G894 Chronic pain syndrome: Secondary | ICD-10-CM | POA: Diagnosis not present

## 2020-12-11 DIAGNOSIS — K5903 Drug induced constipation: Secondary | ICD-10-CM | POA: Diagnosis not present

## 2020-12-11 DIAGNOSIS — Z79899 Other long term (current) drug therapy: Secondary | ICD-10-CM | POA: Diagnosis not present

## 2020-12-11 DIAGNOSIS — Z96659 Presence of unspecified artificial knee joint: Secondary | ICD-10-CM | POA: Diagnosis not present

## 2020-12-11 DIAGNOSIS — G8918 Other acute postprocedural pain: Secondary | ICD-10-CM | POA: Diagnosis not present

## 2020-12-11 DIAGNOSIS — M79671 Pain in right foot: Secondary | ICD-10-CM | POA: Diagnosis not present

## 2020-12-11 DIAGNOSIS — M1991 Primary osteoarthritis, unspecified site: Secondary | ICD-10-CM | POA: Diagnosis not present

## 2020-12-11 DIAGNOSIS — S9031XA Contusion of right foot, initial encounter: Secondary | ICD-10-CM | POA: Diagnosis not present

## 2020-12-11 DIAGNOSIS — M1711 Unilateral primary osteoarthritis, right knee: Secondary | ICD-10-CM | POA: Diagnosis not present

## 2020-12-11 DIAGNOSIS — K5900 Constipation, unspecified: Secondary | ICD-10-CM | POA: Diagnosis not present

## 2020-12-29 ENCOUNTER — Encounter: Payer: Self-pay | Admitting: General Surgery

## 2021-01-14 ENCOUNTER — Other Ambulatory Visit: Payer: Self-pay | Admitting: Family Medicine

## 2021-02-06 DIAGNOSIS — K5903 Drug induced constipation: Secondary | ICD-10-CM | POA: Diagnosis not present

## 2021-02-06 DIAGNOSIS — M1991 Primary osteoarthritis, unspecified site: Secondary | ICD-10-CM | POA: Diagnosis not present

## 2021-02-06 DIAGNOSIS — S9031XA Contusion of right foot, initial encounter: Secondary | ICD-10-CM | POA: Diagnosis not present

## 2021-02-06 DIAGNOSIS — M19079 Primary osteoarthritis, unspecified ankle and foot: Secondary | ICD-10-CM | POA: Diagnosis not present

## 2021-02-06 DIAGNOSIS — M79671 Pain in right foot: Secondary | ICD-10-CM | POA: Diagnosis not present

## 2021-02-06 DIAGNOSIS — Z79899 Other long term (current) drug therapy: Secondary | ICD-10-CM | POA: Diagnosis not present

## 2021-02-06 DIAGNOSIS — G894 Chronic pain syndrome: Secondary | ICD-10-CM | POA: Diagnosis not present

## 2021-02-06 DIAGNOSIS — Z96659 Presence of unspecified artificial knee joint: Secondary | ICD-10-CM | POA: Diagnosis not present

## 2021-02-06 DIAGNOSIS — M1711 Unilateral primary osteoarthritis, right knee: Secondary | ICD-10-CM | POA: Diagnosis not present

## 2021-02-06 DIAGNOSIS — K5900 Constipation, unspecified: Secondary | ICD-10-CM | POA: Diagnosis not present

## 2021-02-06 DIAGNOSIS — M25561 Pain in right knee: Secondary | ICD-10-CM | POA: Diagnosis not present

## 2021-04-01 ENCOUNTER — Telehealth: Payer: Self-pay

## 2021-04-01 DIAGNOSIS — Z853 Personal history of malignant neoplasm of breast: Secondary | ICD-10-CM

## 2021-04-01 NOTE — Telephone Encounter (Signed)
Copied from [place] [id number]. Topic: General - Other >> Apr 01, 2021  1:08 PM Tessa Lerner A wrote: Reason for CRM: The patient has called to request orders for a diagnostic mammogram sent to [facility]   Please contact further if needed

## 2021-04-02 DIAGNOSIS — G8918 Other acute postprocedural pain: Secondary | ICD-10-CM | POA: Diagnosis not present

## 2021-04-02 DIAGNOSIS — Z79899 Other long term (current) drug therapy: Secondary | ICD-10-CM | POA: Diagnosis not present

## 2021-04-02 DIAGNOSIS — M79671 Pain in right foot: Secondary | ICD-10-CM | POA: Diagnosis not present

## 2021-04-02 DIAGNOSIS — S9031XA Contusion of right foot, initial encounter: Secondary | ICD-10-CM | POA: Diagnosis not present

## 2021-04-02 DIAGNOSIS — M1991 Primary osteoarthritis, unspecified site: Secondary | ICD-10-CM | POA: Diagnosis not present

## 2021-04-02 DIAGNOSIS — G894 Chronic pain syndrome: Secondary | ICD-10-CM | POA: Diagnosis not present

## 2021-04-02 DIAGNOSIS — M1711 Unilateral primary osteoarthritis, right knee: Secondary | ICD-10-CM | POA: Diagnosis not present

## 2021-04-02 DIAGNOSIS — K5900 Constipation, unspecified: Secondary | ICD-10-CM | POA: Diagnosis not present

## 2021-04-02 DIAGNOSIS — Z96659 Presence of unspecified artificial knee joint: Secondary | ICD-10-CM | POA: Diagnosis not present

## 2021-04-02 DIAGNOSIS — K5903 Drug induced constipation: Secondary | ICD-10-CM | POA: Diagnosis not present

## 2021-04-02 NOTE — Telephone Encounter (Signed)
Spoke with patient and she prefers [facility]

## 2021-04-02 NOTE — Addendum Note (Signed)
Addended by: Doristine Devoid on: 04/02/2021 02:19 PM   Modules accepted: Orders

## 2021-04-02 NOTE — Addendum Note (Signed)
Addended by: Doristine Devoid on: 04/02/2021 08:52 AM   Modules accepted: Orders

## 2021-04-02 NOTE — Telephone Encounter (Signed)
Per [facility] all mammograms must be TOMO,Can someone fix the order,Thanks

## 2021-04-02 NOTE — Telephone Encounter (Signed)
Fixed.

## 2021-04-06 ENCOUNTER — Inpatient Hospital Stay
Admission: RE | Admit: 2021-04-06 | Discharge: 2021-04-06 | Disposition: A | Discharge status: Home / Self Care | Payer: Self-pay | Source: Ambulatory Visit | Attending: *Deleted | Admitting: *Deleted

## 2021-04-06 ENCOUNTER — Other Ambulatory Visit: Payer: Self-pay | Admitting: *Deleted

## 2021-04-06 DIAGNOSIS — Z1231 Encounter for screening mammogram for malignant neoplasm of breast: Secondary | ICD-10-CM

## 2021-04-22 ENCOUNTER — Ambulatory Visit
Admission: RE | Admit: 2021-04-22 | Discharge: 2021-04-22 | Disposition: A | Discharge status: Home / Self Care | Payer: Medicare Other | Source: Ambulatory Visit | Attending: Family Medicine | Admitting: Family Medicine

## 2021-04-22 ENCOUNTER — Other Ambulatory Visit: Payer: Self-pay

## 2021-04-22 DIAGNOSIS — Z853 Personal history of malignant neoplasm of breast: Secondary | ICD-10-CM | POA: Insufficient documentation

## 2021-04-22 DIAGNOSIS — Z79899 Other long term (current) drug therapy: Secondary | ICD-10-CM | POA: Diagnosis not present

## 2021-04-22 DIAGNOSIS — Z5181 Encounter for therapeutic drug level monitoring: Secondary | ICD-10-CM | POA: Diagnosis not present

## 2021-04-22 DIAGNOSIS — N6489 Other specified disorders of breast: Secondary | ICD-10-CM | POA: Diagnosis not present

## 2021-04-22 NOTE — Progress Notes (Signed)
Established patient visit   Patient: Jacqueline Bowman   DOB: 05-Jan-1966   56 y.o. Female  MRN: [id number] Visit Date: 04/23/2021  Today's healthcare provider: Lavon Paganini, MD   Chief Complaint  Patient presents with   Breast Mass   Subjective    HPI  Patient here today to have labs done. Reports that she is followed by Oncology. She was last seen 8 months ago. She had a breast exam yesterday at some clinic, unclear where and was sent for mammo. History of breast cancer in 200 s/p b/l mastectomy.     IMPRESSION: 1. There is a 2.9 cm mass at the site of palpable concern in the LEFT lower outer chest/flank which is most consistent with a fat lobule versus lipoma. No sonographic evidence of malignancy at the sites of palpable concern. Any further workup of the patient's symptoms should be based on the clinical assessment.   RECOMMENDATION: 1. Given diagnosis of malignancy at a young age, screening with breast MRI with and without contrast could be considered. Otherwise recommend continued clinical follow-up of patient status post bilateral mastectomy with reconstruction.  Medications: Outpatient Medications Prior to Visit  Medication Sig   cetirizine (ZYRTEC) 10 MG tablet Take 1 tablet (10 mg total) by mouth daily.   diclofenac Sodium (VOLTAREN) 1 % GEL Apply 1 application topically daily as needed (pain).   Multiple Vitamins-Minerals (MULTIVITAMIN WITH MINERALS) tablet Take 1 tablet by mouth daily. Complete   oxyCODONE-acetaminophen (PERCOCET) 10-325 MG tablet Take 1 tablet by mouth every 4 (four) hours as needed for pain.   pregabalin (LYRICA) 50 MG capsule Take 50 mg by mouth daily.   topiramate (TOPAMAX) 50 MG tablet Take 50 mg by mouth daily.   No facility-administered medications prior to visit.    Review of Systems per HPI     Objective    BP 140/88 (BP Location: Right Arm, Patient Position: Sitting, Cuff Size: Normal)    Pulse 90    Temp 97.8 F (36.6  C) (Oral)    Resp 16    Ht 5\' 5"  (1.651 m)    Wt 170 lb 9.6 oz (77.4 kg)    BMI 28.39 kg/m  {Show previous vital signs (optional):23777}  Physical Exam Vitals reviewed.  Constitutional:      General: She is not in acute distress.    Appearance: Normal appearance. She is well-developed. She is not diaphoretic.  HENT:     Head: Normocephalic and atraumatic.  Eyes:     General: No scleral icterus.    Conjunctiva/sclera: Conjunctivae normal.  Neck:     Thyroid: No thyromegaly.  Cardiovascular:     Rate and Rhythm: Normal rate and regular rhythm.     Pulses: Normal pulses.     Heart sounds: Normal heart sounds. No murmur heard. Pulmonary:     Effort: Pulmonary effort is normal. No respiratory distress.     Breath sounds: Normal breath sounds. No wheezing, rhonchi or rales.  Musculoskeletal:     Cervical back: Neck supple.     Right lower leg: No edema.     Left lower leg: No edema.     Comments: 3cm mobile, nontender, non-firm mass below L breast over rib cage  Lymphadenopathy:     Cervical: No cervical adenopathy.  Skin:    General: Skin is warm and dry.     Findings: No rash.  Neurological:     Mental Status: She is alert and oriented to person, place,  and time. Mental status is at baseline.  Psychiatric:        Mood and Affect: Mood normal.        Behavior: Behavior normal.      No results found for any visits on 04/23/21.  Assessment & Plan     Problem List Items Addressed This Visit   None Visit Diagnoses     Mass of lower outer quadrant of left breast    -  Primary   Relevant Orders   Ambulatory referral to General Surgery   MR BREAST BILATERAL [facility] [place] CAD      - reviewed mammo - appears benign - feels like lipoma on exam - patient understandably concerned with her h/o breast cancer - will try to obtain breast MRI - send to breast surgery for further eval with possible removal vs biopsy  No follow-ups on file.      I, Lavon Paganini, MD,  have reviewed all documentation for this visit. The documentation on 04/24/21 for the exam, diagnosis, procedures, and orders are all accurate and complete.   Dartanyan Deasis, Dionne Bucy, MD, MPH [facility]

## 2021-04-23 ENCOUNTER — Other Ambulatory Visit: Payer: Self-pay

## 2021-04-23 ENCOUNTER — Encounter: Payer: Self-pay | Admitting: Family Medicine

## 2021-04-23 ENCOUNTER — Ambulatory Visit (INDEPENDENT_AMBULATORY_CARE_PROVIDER_SITE_OTHER): Payer: Medicare Other | Admitting: Family Medicine

## 2021-04-23 VITALS — BP 140/88 | HR 90 | Temp 97.8°F | Resp 16 | Ht 65.0 in | Wt 170.6 lb

## 2021-04-23 DIAGNOSIS — N6323 Unspecified lump in the left breast, lower outer quadrant: Secondary | ICD-10-CM | POA: Diagnosis not present

## 2021-04-23 DIAGNOSIS — Z853 Personal history of malignant neoplasm of breast: Secondary | ICD-10-CM | POA: Diagnosis not present

## 2021-05-03 ENCOUNTER — Other Ambulatory Visit: Payer: Self-pay | Admitting: Family Medicine

## 2021-05-05 ENCOUNTER — Other Ambulatory Visit: Payer: Self-pay

## 2021-05-05 ENCOUNTER — Ambulatory Visit
Admission: RE | Admit: 2021-05-05 | Discharge: 2021-05-05 | Disposition: A | Discharge status: Home / Self Care | Payer: Medicare Other | Source: Ambulatory Visit | Attending: Family Medicine | Admitting: Family Medicine

## 2021-05-05 DIAGNOSIS — N6323 Unspecified lump in the left breast, lower outer quadrant: Secondary | ICD-10-CM | POA: Insufficient documentation

## 2021-05-05 DIAGNOSIS — Z853 Personal history of malignant neoplasm of breast: Secondary | ICD-10-CM | POA: Diagnosis not present

## 2021-05-05 MED ORDER — GADOBUTROL 1 MMOL/ML IV SOLN
7.5000 mL | Freq: Once | INTRAVENOUS | Status: AC | PRN
  Administered 2021-05-05: 7.5 mL via INTRAVENOUS

## 2021-05-07 DIAGNOSIS — Z853 Personal history of malignant neoplasm of breast: Secondary | ICD-10-CM | POA: Diagnosis not present

## 2021-05-07 DIAGNOSIS — R222 Localized swelling, mass and lump, trunk: Secondary | ICD-10-CM | POA: Diagnosis not present

## 2021-05-27 DIAGNOSIS — H524 Presbyopia: Secondary | ICD-10-CM | POA: Diagnosis not present

## 2021-06-02 ENCOUNTER — Other Ambulatory Visit: Payer: Self-pay | Admitting: General Surgery

## 2021-06-02 DIAGNOSIS — D171 Benign lipomatous neoplasm of skin and subcutaneous tissue of trunk: Secondary | ICD-10-CM | POA: Diagnosis not present

## 2021-06-04 LAB — SURGICAL PATHOLOGY

## 2021-06-09 ENCOUNTER — Ambulatory Visit (INDEPENDENT_AMBULATORY_CARE_PROVIDER_SITE_OTHER): Payer: Medicare Other

## 2021-06-09 ENCOUNTER — Other Ambulatory Visit: Payer: Self-pay

## 2021-06-09 VITALS — Wt 170.0 lb

## 2021-06-09 DIAGNOSIS — Z0000 Encounter for general adult medical examination without abnormal findings: Secondary | ICD-10-CM

## 2021-06-09 DIAGNOSIS — Z1211 Encounter for screening for malignant neoplasm of colon: Secondary | ICD-10-CM

## 2021-06-09 MED ORDER — NA SULFATE-K SULFATE-MG SULF 17.5-3.13-1.6 GM/177ML PO SOLN: 1.0000 | Freq: Once | ORAL | 0 refills | Status: AC

## 2021-06-09 NOTE — Patient Instructions (Addendum)
Jacqueline Bowman , ?Thank you for taking time to come for your Medicare Wellness Visit. I appreciate your ongoing commitment to your health goals. Please review the following plan we discussed and let me know if I can assist you in the future.  ? ?Screening recommendations/referrals: ?Colonoscopy: referral sent ?Mammogram: 04/06/21 ?Bone Density: n/d ?Recommended yearly ophthalmology/optometry visit for glaucoma screening and checkup ?Recommended yearly dental visit for hygiene and checkup ? ?Vaccinations: ?Influenza vaccine: 03/09/21 ?Pneumococcal vaccine: n/d ?Tdap vaccine: 05/15/10, due ?Shingles vaccine: n/d  ?Covid-19: 05/31/19, 07/04/19, 12/14/19, 03/09/21 ? ?Advanced directives: no ? ?Conditions/risks identified: none ? ?Next appointment: Follow up in one year for your annual wellness visit. - 06/14/22 @ 10:20am by phone ? ?Preventive Care 40-64 Years, Female ?Preventive care refers to lifestyle choices and visits with your health care provider that can promote health and wellness. ?What does preventive care include? ?A yearly physical exam. This is also called an annual well check. ?Dental exams once or twice a year. ?Routine eye exams. Ask your health care provider how often you should have your eyes checked. ?Personal lifestyle choices, including: ?Daily care of your teeth and gums. ?Regular physical activity. ?Eating a healthy diet. ?Avoiding tobacco and drug use. ?Limiting alcohol use. ?Practicing safe sex. ?Taking low-dose aspirin daily starting at age 8. ?Taking vitamin and mineral supplements as recommended by your health care provider. ?What happens during an annual well check? ?The services and screenings done by your health care provider during your annual well check will depend on your age, overall health, lifestyle risk factors, and family history of disease. ?Counseling  ?Your health care provider may ask you questions about your: ?Alcohol use. ?Tobacco use. ?Drug use. ?Emotional well-being. ?Home and  relationship well-being. ?Sexual activity. ?Eating habits. ?Work and work Statistician. ?Method of birth control. ?Menstrual cycle. ?Pregnancy history. ?Screening  ?You may have the following tests or measurements: ?Height, weight, and BMI. ?Blood pressure. ?Lipid and cholesterol levels. These may be checked every 5 years, or more frequently if you are over 61 years old. ?Skin check. ?Lung cancer screening. You may have this screening every year starting at age 80 if you have a 30-pack-year history of smoking and currently smoke or have quit within the past 15 years. ?Fecal occult blood test (FOBT) of the stool. You may have this test every year starting at age 10. ?Flexible sigmoidoscopy or colonoscopy. You may have a sigmoidoscopy every 5 years or a colonoscopy every 10 years starting at age 24. ?Hepatitis C blood test. ?Hepatitis B blood test. ?Sexually transmitted disease (STD) testing. ?Diabetes screening. This is done by checking your blood sugar (glucose) after you have not eaten for a while (fasting). You may have this done every 1-3 years. ?Mammogram. This may be done every 1-2 years. Talk to your health care provider about when you should start having regular mammograms. This may depend on whether you have a family history of breast cancer. ?BRCA-related cancer screening. This may be done if you have a family history of breast, ovarian, tubal, or peritoneal cancers. ?Pelvic exam and Pap test. This may be done every 3 years starting at age 52. Starting at age 70, this may be done every 5 years if you have a Pap test in combination with an HPV test. ?Bone density scan. This is done to screen for osteoporosis. You may have this scan if you are at high risk for osteoporosis. ?Discuss your test results, treatment options, and if necessary, the need for more tests with your health  care provider. ?Vaccines  ?Your health care provider may recommend certain vaccines, such as: ?Influenza vaccine. This is recommended  every year. ?Tetanus, diphtheria, and acellular pertussis (Tdap, Td) vaccine. You may need a Td booster every 10 years. ?Zoster vaccine. You may need this after age 61. ?Pneumococcal 13-valent conjugate (PCV13) vaccine. You may need this if you have certain conditions and were not previously vaccinated. ?Pneumococcal polysaccharide (PPSV23) vaccine. You may need one or two doses if you smoke cigarettes or if you have certain conditions. ?Talk to your health care provider about which screenings and vaccines you need and how often you need them. ?This information is not intended to replace advice given to you by your health care provider. Make sure you discuss any questions you have with your health care provider. ?Document Released: 04/04/2015 Document Revised: 11/26/2015 Document Reviewed: 01/07/2015 ?Elsevier Interactive Patient Education ? 2017 [employer] ? ? ? ?Fall Prevention in the Home ?Falls can cause injuries. They can happen to people of all ages. There are many things you can do to make your home safe and to help prevent falls. ?What can I do on the outside of my home? ?Regularly fix the edges of walkways and driveways and fix any cracks. ?Remove anything that might make you trip as you walk through a door, such as a raised step or threshold. ?Trim any bushes or trees on the path to your home. ?Use bright outdoor lighting. ?Clear any walking paths of anything that might make someone trip, such as rocks or tools. ?Regularly check to see if handrails are loose or broken. Make sure that both sides of any steps have handrails. ?Any raised decks and porches should have guardrails on the edges. ?Have any leaves, snow, or ice cleared regularly. ?Use sand or salt on walking paths during winter. ?Clean up any spills in your garage right away. This includes oil or grease spills. ?What can I do in the bathroom? ?Use night lights. ?Install grab bars by the toilet and in the tub and shower. Do not use towel bars as  grab bars. ?Use non-skid mats or decals in the tub or shower. ?If you need to sit down in the shower, use a plastic, non-slip stool. ?Keep the floor dry. Clean up any water that spills on the floor as soon as it happens. ?Remove soap buildup in the tub or shower regularly. ?Attach bath mats securely with double-sided non-slip rug tape. ?Do not have throw rugs and other things on the floor that can make you trip. ?What can I do in the bedroom? ?Use night lights. ?Make sure that you have a light by your bed that is easy to reach. ?Do not use any sheets or blankets that are too big for your bed. They should not hang down onto the floor. ?Have a firm chair that has side arms. You can use this for support while you get dressed. ?Do not have throw rugs and other things on the floor that can make you trip. ?What can I do in the kitchen? ?Clean up any spills right away. ?Avoid walking on wet floors. ?Keep items that you use a lot in easy-to-reach places. ?If you need to reach something above you, use a strong step stool that has a grab bar. ?Keep electrical cords out of the way. ?Do not use floor polish or wax that makes floors slippery. If you must use wax, use non-skid floor wax. ?Do not have throw rugs and other things on the floor that can  make you trip. ?What can I do with my stairs? ?Do not leave any items on the stairs. ?Make sure that there are handrails on both sides of the stairs and use them. Fix handrails that are broken or loose. Make sure that handrails are as long as the stairways. ?Check any carpeting to make sure that it is firmly attached to the stairs. Fix any carpet that is loose or worn. ?Avoid having throw rugs at the top or bottom of the stairs. If you do have throw rugs, attach them to the floor with carpet tape. ?Make sure that you have a light switch at the top of the stairs and the bottom of the stairs. If you do not have them, ask someone to add them for you. ?What else can I do to help prevent  falls? ?Wear shoes that: ?Do not have high heels. ?Have rubber bottoms. ?Are comfortable and fit you well. ?Are closed at the toe. Do not wear sandals. ?If you use a stepladder: ?Make sure that it is fully

## 2021-06-09 NOTE — Progress Notes (Signed)
Gastroenterology Pre-Procedure Review ? ?Request Date: 06/26/2021 ?Requesting Physician: Dr. Hermine Messick ? ?PATIENT REVIEW QUESTIONS: The patient responded to the following health history questions as indicated:   ? ?1. Are you having any GI issues? no ?2. Do you have a personal history of Polyps? no ?3. Do you have a family history of Colon Cancer or Polyps? no ?4. Diabetes Mellitus? no ?5. Joint replacements in the past 12 months?no ?6. Major health problems in the past 3 months?no ?7. Any artificial heart valves, MVP, or defibrillator?no ?   ?MEDICATIONS & ALLERGIES:    ?Patient reports the following regarding taking any anticoagulation/antiplatelet therapy:   ?Plavix, Coumadin, Eliquis, Xarelto, Lovenox, Pradaxa, Brilinta, or Effient? no ?Aspirin? no ? ?Patient confirms/reports the following medications:  ?Current Outpatient Medications  ?Medication Sig Dispense Refill  ? cetirizine (ZYRTEC) 10 MG tablet TAKE 1 TABLET BY MOUTH EVERY DAY 90 tablet 3  ? diclofenac Sodium (VOLTAREN) 1 % GEL Apply 1 application topically daily as needed (pain).    ? FLULAVAL QUADRIVALENT 0.5 ML injection     ? fluticasone (FLONASE) 50 MCG/ACT nasal spray Place into the nose.    ? MODERNA COVID-19 BIVAL BOOSTER 50 MCG/0.5ML injection     ? Multiple Vitamins-Minerals (MULTIVITAMIN WITH MINERALS) tablet Take 1 tablet by mouth daily. Complete    ? oxyCODONE (OXY IR/ROXICODONE) 5 MG immediate release tablet TAKE 1 TO 2 TABLETS BY MOUTH TWICE A DAY AS NEEDED    ? oxyCODONE-acetaminophen (PERCOCET) 10-325 MG tablet Take 1 tablet by mouth every 4 (four) hours as needed for pain.    ? topiramate (TOPAMAX) 50 MG tablet Take 50 mg by mouth daily.    ? ?No current facility-administered medications for this visit.  ? ? ?Patient confirms/reports the following allergies:  ?No Known Allergies ? ?No orders of the defined types were placed in this encounter. ? ? ?AUTHORIZATION INFORMATION ?Primary Insurance: ?1D#: ?Group #: ? ?Secondary  Insurance: ?1D#: ?Group #: ? ?SCHEDULE INFORMATION: ?Date: 06/26/2021 ?Time: ?Location:[facility] ? ?

## 2021-06-09 NOTE — Progress Notes (Signed)
?Virtual Visit via Telephone Note ? ?I connected with  Jacqueline Bowman on 06/09/21 at 11:00 AM EDT by telephone and verified that I am speaking with the correct person using two identifiers. ? ?Location: ?Patient: home ?Provider: [facility] ?Persons participating in the virtual visit: patient/Nurse Health Advisor ?  ?I discussed the limitations, risks, security and privacy concerns of performing an evaluation and management service by telephone and the availability of in person appointments. The patient expressed understanding and agreed to proceed. ? ?Interactive audio and video telecommunications were attempted between this nurse and patient, however failed, due to patient having technical difficulties OR patient did not have access to video capability.  We continued and completed visit with audio only. ? ?Some vital signs may be absent or patient reported.  ? ?Dionisio David, LPN ? ?Subjective:  ? Jacqueline Bowman is a 56 y.o. female who presents for Medicare Annual (Subsequent) preventive examination. ? ?Review of Systems    ? ?  ? ?   ?Objective:  ?  ?Today's Vitals  ? 06/09/21 1055  ?Weight: 170 lb (77.1 kg)  ? ?Body mass index is 28.29 kg/m?. ? ?Advanced Directives 04/18/2020 09/10/2018 03/07/2017 11/22/2016 07/11/2016 06/18/2015 02/14/2015  ?Does Patient Have a Medical Advance Directive? No No No No No No No  ?Would patient like information on creating a medical advance directive? No - Patient declined No - Patient declined No - Patient declined No - Patient declined - - No - patient declined information  ? ? ?Current Medications (verified) ?Outpatient Encounter Medications as of 06/09/2021  ?Medication Sig  ? fluticasone (FLONASE) 50 MCG/ACT nasal spray Place into the nose.  ? oxyCODONE (OXY IR/ROXICODONE) 5 MG immediate release tablet TAKE 1 TO 2 TABLETS BY MOUTH TWICE A DAY AS NEEDED  ? pregabalin (LYRICA) 50 MG capsule Take by mouth.  ? cetirizine (ZYRTEC) 10 MG tablet TAKE 1 TABLET BY MOUTH EVERY DAY  ? diclofenac  Sodium (VOLTAREN) 1 % GEL Apply 1 application topically daily as needed (pain).  ? FLULAVAL QUADRIVALENT 0.5 ML injection   ? MODERNA COVID-19 BIVAL BOOSTER 50 MCG/0.5ML injection   ? Multiple Vitamins-Minerals (MULTIVITAMIN WITH MINERALS) tablet Take 1 tablet by mouth daily. Complete  ? oxyCODONE-acetaminophen (PERCOCET) 10-325 MG tablet Take 1 tablet by mouth every 4 (four) hours as needed for pain.  ? pregabalin (LYRICA) 50 MG capsule Take 50 mg by mouth daily.  ? topiramate (TOPAMAX) 50 MG tablet Take 50 mg by mouth daily.  ? ?No facility-administered encounter medications on file as of 06/09/2021.  ? ? ?Allergies (verified) ?Patient has no known allergies.  ? ?History: ?Past Medical History:  ?Diagnosis Date  ? Breast cancer ([place])   ? January 2000 left  ? Hypertension   ? ?Past Surgical History:  ?Procedure Laterality Date  ? ABDOMINAL HYSTERECTOMY  2004  ? BREAST SURGERY    ? FOREIGN BODY REMOVAL N/A 04/18/2020  ? Procedure: FOREIGN BODY REMOVAL ADULT, exploration of umbilicus;  Surgeon: Fredirick Maudlin, MD;  Location: [facility] ORS;  Service: General;  Laterality: N/A;  ? FRACTURE SURGERY  2018  ? right foot to correct knee  ? MASTECTOMY Bilateral 2004  ? TOTAL KNEE ARTHROPLASTY Bilateral 2018  ? 2018 left 2019 right  ? ?Family History  ?Problem Relation Age of Onset  ? Breast cancer Mother 45  ? Hypertension Father   ? Hyperlipidemia Father   ? Obesity Father   ? Colon cancer Brother 16  ? Heart disease Brother   ? ?Social  History  ? ?Socioeconomic History  ? Marital status: Legally Separated  ?  Spouse name: Not on file  ? Number of children: 1  ? Years of education: Not on file  ? Highest education level: Not on file  ?Occupational History  ? Occupation: disabled  ?Tobacco Use  ? Smoking status: Former  ?  Types: Cigarettes  ? Smokeless tobacco: Never  ? Tobacco comments:  ?  socailly smoking for a few years in the distant past  ?Vaping Use  ? Vaping Use: Never used  ?Substance and Sexual Activity  ? Alcohol  use: Not Currently  ?  Comment: socially  ? Drug use: No  ? Sexual activity: Not Currently  ?Other Topics Concern  ? Not on file  ?Social History Narrative  ? Not on file  ? ?Social Determinants of Health  ? ?Financial Resource Strain: Not on file  ?Food Insecurity: Not on file  ?Transportation Needs: Not on file  ?Physical Activity: Not on file  ?Stress: Not on file  ?Social Connections: Not on file  ? ? ?Tobacco Counseling ?Counseling given: Not Answered ?Tobacco comments: socailly smoking for a few years in the distant past ? ? ?Clinical Intake: ? ?Pre-visit preparation completed: Yes ? ?Pain : No/denies pain ? ?  ? ?Nutritional Risks: None ?Diabetes: No ? ?How often do you need to have someone help you when you read instructions, pamphlets, or other written materials from your doctor or pharmacy?: 1 - Never ? ?Diabetic?no ? ?Interpreter Needed?: No ? ?Information entered by :: Kirke Shaggy, LPN ? ? ?Activities of Daily Living ?In your present state of health, do you have any difficulty performing the following activities: 07/24/2020  ?Hearing? Y  ?Vision? Y  ?Difficulty concentrating or making decisions? N  ?Walking or climbing stairs? Y  ?Dressing or bathing? N  ?Doing errands, shopping? Y  ?Comment Driving per pt  ?Some recent data might be hidden  ? ? ?Patient Care Team: ?Virginia Crews, MD as PCP - General (Family Medicine) ?Rico Junker, RN as Registered Nurse ?Theodore Demark, RN as Registered Nurse ? ?Indicate any recent Medical Services you may have received from other than [facility] providers in the past year (date may be approximate). ? ?   ?Assessment:  ? This is a routine wellness examination for [facility]. ? ?Hearing/Vision screen ?No results found. ? ?Dietary issues and exercise activities discussed: ?  ? ? Goals Addressed   ?None ?  ? ?Depression Screen ?PHQ 2/9 Scores 04/23/2021 07/24/2020 04/14/2020  ?PHQ - 2 Score 2 1 0  ?PHQ- 9 Score 2 4 -  ?  ?Fall Risk ?Fall Risk  04/23/2021 07/24/2020 04/14/2020   ?Falls in the past year? '1 1 1  '$ ?Number falls in past yr: 0 0 0  ?Injury with Fall? 1 1 0  ?Risk for fall due to : - History of fall(s) No Fall Risks;Orthopedic patient  ?Follow up - Falls evaluation completed;Education provided;Falls prevention discussed Falls evaluation completed;Education provided;Falls prevention discussed  ? ? ?FALL RISK PREVENTION PERTAINING TO THE HOME: ? ?Any stairs in or around the home? No  ?If so, are there any without handrails? No  ?Home free of loose throw rugs in walkways, pet beds, electrical cords, etc? Yes  ?Adequate lighting in your home to reduce risk of falls? Yes  ? ?ASSISTIVE DEVICES UTILIZED TO PREVENT FALLS: ? ?Life alert? No  ?Use of a cane, walker or w/c? Yes  ?Grab bars in the bathroom? No  ?Civil engineer, contracting  or bench in shower? Yes  ?Elevated toilet seat or a handicapped toilet? No  ? ? ?Cognitive Function:Normal cognitive status assessed by direct observation by this Nurse Health Advisor. No abnormalities found.  ? ?  ?  ?  ? ?Immunizations ?Immunization History  ?Administered Date(s) Administered  ? Influenza-Unspecified 03/09/2021  ? Moderna Covid-19 Vaccine Bivalent Booster 30yr & up 03/09/2021  ? Moderna Sars-Covid-2 Vaccination 05/31/2019, 07/04/2019, 12/14/2019  ? Tdap 05/15/2010  ? ? ?TDAP status: Due, Education has been provided regarding the importance of this vaccine. Advised may receive this vaccine at local pharmacy or Health Dept. Aware to provide a copy of the vaccination record if obtained from local pharmacy or Health Dept. Verbalized acceptance and understanding. ? ?Flu Vaccine status: Up to date ? ?Pneumococcal vaccine status: Declined,  Education has been provided regarding the importance of this vaccine but patient still declined. Advised may receive this vaccine at local pharmacy or Health Dept. Aware to provide a copy of the vaccination record if obtained from local pharmacy or Health Dept. Verbalized acceptance and understanding.  ? ?Covid-19 vaccine  status: Completed vaccines ? ?Qualifies for Shingles Vaccine? Yes   ?Zostavax completed No   ?Shingrix Completed?: Yes ? ?Screening Tests ?Health Maintenance  ?Topic Date Due  ? Zoster Vaccines- Shingrix (1 of

## 2021-06-18 DIAGNOSIS — R252 Cramp and spasm: Secondary | ICD-10-CM | POA: Diagnosis not present

## 2021-06-18 DIAGNOSIS — G894 Chronic pain syndrome: Secondary | ICD-10-CM | POA: Diagnosis not present

## 2021-06-18 DIAGNOSIS — S9031XA Contusion of right foot, initial encounter: Secondary | ICD-10-CM | POA: Diagnosis not present

## 2021-06-18 DIAGNOSIS — Z79899 Other long term (current) drug therapy: Secondary | ICD-10-CM | POA: Diagnosis not present

## 2021-06-18 DIAGNOSIS — M1991 Primary osteoarthritis, unspecified site: Secondary | ICD-10-CM | POA: Diagnosis not present

## 2021-06-18 DIAGNOSIS — K5903 Drug induced constipation: Secondary | ICD-10-CM | POA: Diagnosis not present

## 2021-06-18 DIAGNOSIS — G8918 Other acute postprocedural pain: Secondary | ICD-10-CM | POA: Diagnosis not present

## 2021-06-18 DIAGNOSIS — M1711 Unilateral primary osteoarthritis, right knee: Secondary | ICD-10-CM | POA: Diagnosis not present

## 2021-06-22 ENCOUNTER — Emergency Department: Payer: Medicare Other

## 2021-06-22 ENCOUNTER — Other Ambulatory Visit: Payer: Self-pay

## 2021-06-22 DIAGNOSIS — S99921A Unspecified injury of right foot, initial encounter: Secondary | ICD-10-CM | POA: Insufficient documentation

## 2021-06-22 DIAGNOSIS — M79674 Pain in right toe(s): Secondary | ICD-10-CM | POA: Insufficient documentation

## 2021-06-22 DIAGNOSIS — W228XXA Striking against or struck by other objects, initial encounter: Secondary | ICD-10-CM | POA: Insufficient documentation

## 2021-06-22 DIAGNOSIS — M79671 Pain in right foot: Secondary | ICD-10-CM | POA: Diagnosis not present

## 2021-06-22 NOTE — ED Triage Notes (Signed)
Pt presents to ER c/o right middle toe pain that started 3.5 weeks ago after stubbing her toe.  Pt states she has not had it checked out but states pain has been there since it happened.  Pt ambulatory to triage and in NAD.    ?

## 2021-06-23 ENCOUNTER — Emergency Department
Admission: EM | Admit: 2021-06-23 | Discharge: 2021-06-23 | Disposition: A | Discharge status: Home / Self Care | Payer: Medicare Other | Attending: Emergency Medicine | Admitting: Emergency Medicine

## 2021-06-23 DIAGNOSIS — S99921A Unspecified injury of right foot, initial encounter: Secondary | ICD-10-CM

## 2021-06-23 MED ORDER — IBUPROFEN 600 MG PO TABS: 600.0000 mg | ORAL_TABLET | Freq: Four times a day (QID) | ORAL | 0 refills | Status: DC | PRN

## 2021-06-23 NOTE — Discharge Instructions (Signed)
You likely have a sprain or contusion of the toe.  There is no fracture on your x-rays.  Use a hard soled shoe, keep the toe "buddy-taped" to its neighbor, and follow-up with podiatry. ?

## 2021-06-23 NOTE — ED Provider Notes (Signed)
? ?[facility] ?Provider Note ? ? ? Event Date/Time  ? First MD Initiated Contact with Patient 06/23/21 0203   ?  (approximate) ? ? ?History  ? ?Toe Pain ? ? ?HPI ? ?Jacqueline Bowman is a 56 y.o. female who presents with right third toe pain over the last 3 weeks after she stubbed it.  She denies other injuries.  The patient states that she has been walking on it and the pain has persisted.  She denies any numbness or tingling.  She has no redness or swelling. ? ? ? ?Physical Exam  ? ?Triage Vital Signs: ?ED Triage Vitals  ?Enc Vitals Group  ?   BP 06/22/21 2304 132/77  ?   Pulse Rate 06/22/21 2304 66  ?   Resp 06/22/21 2304 18  ?   Temp 06/22/21 2304 97.6 ?F (36.4 ?C)  ?   Temp Source 06/22/21 2304 Oral  ?   SpO2 06/22/21 2304 99 %  ?   Weight 06/22/21 2306 170 lb (77.1 kg)  ?   Height 06/22/21 2306 '5\' 5"'$  (1.651 m)  ?   Head Circumference --   ?   Peak Flow --   ?   Pain Score 06/22/21 2306 8  ?   Pain Loc --   ?   Pain Edu? --   ?   Excl. in [place]? --   ? ? ?Most recent vital signs: ?Vitals:  ? 06/22/21 2304 06/23/21 0241  ?BP: 132/77 118/65  ?Pulse: 66 74  ?Resp: 18 16  ?Temp: 97.6 ?F (36.4 ?C) 98.3 ?F (36.8 ?C)  ?SpO2: 99% 99%  ? ? ? ?General: Awake, no distress.  ?CV:  Good peripheral perfusion.  ?Resp:  Normal effort.  ?Abd:  No distention.  ?Other:  Right third toe with mild tenderness, normal range of motion active and passive, and no erythema, induration, or abnormal warmth. ? ? ?ED Results / Procedures / Treatments  ? ?Labs ?(all labs ordered are listed, but only abnormal results are displayed) ?Labs Reviewed - No data to display ? ? ?EKG ? ? ? ? ?RADIOLOGY ? ?XR R foot: I independently viewed and interpreted the images; there is no acute fracture or dislocation ? ?PROCEDURES: ? ?Critical Care performed: No ? ?Procedures ? ? ?MEDICATIONS ORDERED IN ED: ?Medications - No data to display ? ? ?IMPRESSION / MDM / ASSESSMENT AND PLAN / ED COURSE  ?I reviewed the triage vital signs and the  nursing notes. ? ?56 year old female presents with right great toe pain for the last 3 weeks after she stubbed it.  She decided to be evaluated after being here with a family member who is also being seen.  On exam the patient is well-appearing, her vital signs are normal, and the toe demonstrates mild tenderness but no deformity and no other acute abnormalities. ? ?Overall I suspect likely sprain or contusion.  The patient has not immobilized at all.  X-ray shows no acute fracture. ? ?I buddy taped the toe to its neighbor and recommended that the patient wear a hard soled shoe.  She has 1 at home so declines 1 to be given by [place].  I have prescribed ibuprofen and given the patient podiatry follow-up.  Return precautions given, and she expresses understanding ? ? ?FINAL CLINICAL IMPRESSION(S) / ED DIAGNOSES  ? ?Final diagnoses:  ?Injury of toe on right foot, initial encounter  ? ? ? ?Rx / DC Orders  ? ?ED Discharge Orders   ? ?  Ordered  ?  ibuprofen (ADVIL) 600 MG tablet  Every 6 hours PRN       ? 06/23/21 7639  ? ?  ?  ? ?  ? ? ? ?Note:  This document was prepared using Dragon voice recognition software and may include unintentional dictation errors.  ?  Arta Silence, MD ?06/23/21 0401 ? ?

## 2021-06-23 NOTE — ED Notes (Signed)
Pt ambulatory to 1[facility] without difficulty. Pt also ambulated self to restroom. NAD noted at this time. Awaiting provider. ?

## 2021-06-25 ENCOUNTER — Encounter: Payer: Self-pay | Admitting: Gastroenterology

## 2021-06-26 ENCOUNTER — Encounter: Payer: Self-pay | Admitting: Gastroenterology

## 2021-06-26 ENCOUNTER — Ambulatory Visit: Payer: Medicare Other | Admitting: Anesthesiology

## 2021-06-26 ENCOUNTER — Encounter
Admission: RE | Disposition: A | Discharge status: Home / Self Care | Payer: Self-pay | Source: Ambulatory Visit | Attending: Gastroenterology

## 2021-06-26 ENCOUNTER — Ambulatory Visit
Admission: RE | Admit: 2021-06-26 | Discharge: 2021-06-26 | Disposition: A | Discharge status: Home / Self Care | Payer: Medicare Other | Source: Ambulatory Visit | Attending: Gastroenterology | Admitting: Gastroenterology

## 2021-06-26 DIAGNOSIS — Z87891 Personal history of nicotine dependence: Secondary | ICD-10-CM | POA: Diagnosis not present

## 2021-06-26 DIAGNOSIS — Z1211 Encounter for screening for malignant neoplasm of colon: Secondary | ICD-10-CM | POA: Diagnosis not present

## 2021-06-26 DIAGNOSIS — Z853 Personal history of malignant neoplasm of breast: Secondary | ICD-10-CM | POA: Diagnosis not present

## 2021-06-26 DIAGNOSIS — Z96653 Presence of artificial knee joint, bilateral: Secondary | ICD-10-CM | POA: Diagnosis not present

## 2021-06-26 DIAGNOSIS — I10 Essential (primary) hypertension: Secondary | ICD-10-CM | POA: Diagnosis not present

## 2021-06-26 DIAGNOSIS — Z800 Family history of malignant neoplasm of digestive organs: Secondary | ICD-10-CM | POA: Insufficient documentation

## 2021-06-26 DIAGNOSIS — Z79899 Other long term (current) drug therapy: Secondary | ICD-10-CM | POA: Insufficient documentation

## 2021-06-26 HISTORY — PX: COLONOSCOPY WITH PROPOFOL: SHX5780

## 2021-06-26 SURGERY — COLONOSCOPY WITH PROPOFOL
Anesthesia: General

## 2021-06-26 MED ORDER — EPHEDRINE SULFATE (PRESSORS) 50 MG/ML IJ SOLN
INTRAMUSCULAR | Status: DC | PRN
  Administered 2021-06-26: 10 mg via INTRAVENOUS

## 2021-06-26 MED ORDER — PROPOFOL 500 MG/50ML IV EMUL
INTRAVENOUS | Status: DC | PRN
  Administered 2021-06-26: 150 ug/kg/min via INTRAVENOUS

## 2021-06-26 MED ORDER — PROPOFOL 10 MG/ML IV BOLUS
INTRAVENOUS | Status: DC | PRN
  Administered 2021-06-26: 80 mg via INTRAVENOUS

## 2021-06-26 MED ORDER — PROPOFOL 500 MG/50ML IV EMUL
INTRAVENOUS | Status: AC
  Filled 2021-06-26: qty 50

## 2021-06-26 MED ORDER — PHENYLEPHRINE HCL (PRESSORS) 10 MG/ML IV SOLN
INTRAVENOUS | Status: DC | PRN
Start: 2021-06-26 — End: 2021-06-26
  Administered 2021-06-26 (×2): 80 ug via INTRAVENOUS

## 2021-06-26 MED ORDER — SODIUM CHLORIDE 0.9 % IV SOLN: INTRAVENOUS | Status: DC

## 2021-06-26 MED ORDER — LIDOCAINE HCL (PF) 2 % IJ SOLN
INTRAMUSCULAR | Status: AC
  Filled 2021-06-26: qty 5

## 2021-06-26 MED ORDER — LIDOCAINE HCL (CARDIAC) PF 100 MG/5ML IV SOSY
PREFILLED_SYRINGE | INTRAVENOUS | Status: DC | PRN
  Administered 2021-06-26: 50 mg via INTRAVENOUS

## 2021-06-26 NOTE — H&P (Signed)
? ? ? ?Jonathon Bellows, MD ?[address], [place], [place], [place], [address] ?[address] [place], [place], [place], [address] ?Phone: [phone]  ?Fax: [phone] ? ?Primary Care Physician:  Virginia Crews, MD ? ? ?Pre-Procedure History & Physical: ?HPI:  Jacqueline Bowman is a 56 y.o. female is here for an colonoscopy. ?  ?Past Medical History:  ?Diagnosis Date  ? Breast cancer ([place])   ? January 2000 left  ? Hypertension   ? ? ?Past Surgical History:  ?Procedure Laterality Date  ? ABDOMINAL HYSTERECTOMY  2004  ? BREAST SURGERY    ? FOREIGN BODY REMOVAL N/A 04/18/2020  ? Procedure: FOREIGN BODY REMOVAL ADULT, exploration of umbilicus;  Surgeon: Fredirick Maudlin, MD;  Location: [facility] ORS;  Service: General;  Laterality: N/A;  ? FRACTURE SURGERY  2018  ? right foot to correct knee  ? MASTECTOMY Bilateral 2004  ? TOTAL KNEE ARTHROPLASTY Bilateral 2018  ? 2018 left 2019 right  ? ? ?Prior to Admission medications   ?Medication Sig Start Date End Date Taking? Authorizing Provider  ?ibuprofen (ADVIL) 600 MG tablet Take 1 tablet (600 mg total) by mouth every 6 (six) hours as needed. 06/23/21  Yes Arta Silence, MD  ?oxyCODONE (OXY IR/ROXICODONE) 5 MG immediate release tablet TAKE 1 TO 2 TABLETS BY MOUTH TWICE A DAY AS NEEDED 05/16/20  Yes [provider]  ?topiramate (TOPAMAX) 50 MG tablet Take 50 mg by mouth daily. 01/03/20  Yes [provider]  ?cetirizine (ZYRTEC) 10 MG tablet TAKE 1 TABLET BY MOUTH EVERY DAY 05/04/21   Virginia Crews, MD  ?diclofenac Sodium (VOLTAREN) 1 % GEL Apply 1 application topically daily as needed (pain). 02/28/20   [provider]  ?FLULAVAL QUADRIVALENT 0.5 ML injection  03/09/21   [provider]  ?fluticasone (FLONASE) 50 MCG/ACT nasal spray Place into the nose. 07/11/20   [provider]  ?Levan Hurst COVID-19 BIVAL BOOSTER 50 MCG/0.5ML injection  03/09/21   [provider]  ?Multiple Vitamins-Minerals (MULTIVITAMIN WITH  MINERALS) tablet Take 1 tablet by mouth daily. Complete    [provider]  ?oxyCODONE-acetaminophen (PERCOCET) 10-325 MG tablet Take 1 tablet by mouth every 4 (four) hours as needed for pain. 03/14/17   [provider]  ? ? ?Allergies as of 06/09/2021  ? (No Known Allergies)  ? ? ?Family History  ?Problem Relation Age of Onset  ? Breast cancer Mother 90  ? Hypertension Father   ? Hyperlipidemia Father   ? Obesity Father   ? Colon cancer Brother 45  ? Heart disease Brother   ? ? ?Social History  ? ?Socioeconomic History  ? Marital status: Legally Separated  ?  Spouse name: Not on file  ? Number of children: 1  ? Years of education: Not on file  ? Highest education level: Not on file  ?Occupational History  ? Occupation: disabled  ?Tobacco Use  ? Smoking status: Former  ?  Types: Cigarettes  ? Smokeless tobacco: Never  ? Tobacco comments:  ?  socailly smoking for a few years in the distant past  ?Vaping Use  ? Vaping Use: Never used  ?Substance and Sexual Activity  ? Alcohol use: Not Currently  ?  Comment: socially  ? Drug use: No  ? Sexual activity: Not Currently  ?Other Topics Concern  ? Not on file  ?Social History Narrative  ? Not on file  ? ?Social Determinants of Health  ? ?Financial Resource Strain: Low Risk   ?  Difficulty of Paying Living Expenses: Not hard at all  ?Food Insecurity: No Food Insecurity  ? Worried About Charity fundraiser in the Last Year: Never true  ? Ran Out of Food in the Last Year: Never true  ?Transportation Needs: No Transportation Needs  ? Lack of Transportation (Medical): No  ? Lack of Transportation (Non-Medical): No  ?Physical Activity: Sufficiently Active  ? Days of Exercise per Week: 4 days  ? Minutes of Exercise per Session: 40 min  ?Stress: No Stress Concern Present  ? Feeling of Stress : Only a little  ?Social Connections: Moderately Integrated  ? Frequency of Communication with Friends and Family: More than three times a week  ? Frequency of Social Gatherings  with Friends and Family: More than three times a week  ? Attends Religious Services: More than 4 times per year  ? Active Member of Clubs or Organizations: Yes  ? Attends Archivist Meetings: 1 to 4 times per year  ? Marital Status: Separated  ?Intimate Partner Violence: Not At Risk  ? Fear of Current or Ex-Partner: No  ? Emotionally Abused: No  ? Physically Abused: No  ? Sexually Abused: No  ? ? ?Review of Systems: ?See HPI, otherwise negative ROS ? ?Physical Exam: ?BP 117/84   Pulse 75   Temp (!) 97.1 ?F (36.2 ?C) (Temporal)   Resp 18   Ht '5\' 5"'$  (1.651 m)   Wt 77.1 kg   BMI 28.29 kg/m?  ?General:   Alert,  pleasant and cooperative in NAD ?Head:  Normocephalic and atraumatic. ?Neck:  Supple; no masses or thyromegaly. ?Lungs:  Clear throughout to auscultation, normal respiratory effort.    ?Heart:  +S1, +S2, Regular rate and rhythm, No edema. ?Abdomen:  Soft, nontender and nondistended. Normal bowel sounds, without guarding, and without rebound.   ?Neurologic:  Alert and  oriented x4;  grossly normal neurologically. ? ?Impression/Plan: ?[name] is here for an colonoscopy to be performed for Screening colonoscopy average risk   ?Risks, benefits, limitations, and alternatives regarding  colonoscopy have been reviewed with the patient.  Questions have been answered.  All parties agreeable. ? ? ?Jonathon Bellows, MD  06/26/2021, 9:59 AM ? ?

## 2021-06-26 NOTE — Anesthesia Postprocedure Evaluation (Signed)
Anesthesia Post Note ? ?Patient: Jacqueline Bowman ? ?Procedure(s) Performed: COLONOSCOPY WITH PROPOFOL ? ?Patient location during evaluation: Endoscopy ?Anesthesia Type: General ?Level of consciousness: awake and alert ?Pain management: pain level controlled ?Vital Signs Assessment: post-procedure vital signs reviewed and stable ?Respiratory status: spontaneous breathing, nonlabored ventilation, respiratory function stable and patient connected to nasal cannula oxygen ?Cardiovascular status: blood pressure returned to baseline and stable ?Postop Assessment: no apparent nausea or vomiting ?Anesthetic complications: no ? ? ?No notable events documented. ? ? ?Last Vitals:  ?Vitals:  ? 06/26/21 1040 06/26/21 1050  ?BP: 111/74 121/73  ?Pulse: 78 74  ?Resp: 15 15  ?Temp:    ?SpO2: 100% 96%  ?  ?Last Pain:  ?Vitals:  ? 06/26/21 1050  ?TempSrc:   ?PainSc: 0-No pain  ? ? ?  ?  ?  ?  ?  ?  ? ?Arita Miss ? ? ? ? ?

## 2021-06-26 NOTE — Anesthesia Preprocedure Evaluation (Signed)
Anesthesia Evaluation  ?Patient identified by MRN, date of birth, ID band ?Patient awake ? ? ? ?Reviewed: ?Allergy & Precautions, H&P , NPO status , Patient's Chart, lab work & pertinent test results ? ?History of Anesthesia Complications ?Negative for: history of anesthetic complications ? ?Airway ?Mallampati: II ? ?TM Distance: >3 FB ?Neck ROM: full ? ? ? Dental ? ?(+) Chipped, Poor Dentition, Missing, Partial Upper ?  ?Pulmonary ?neg pulmonary ROS, neg shortness of breath, former smoker,  ?  ?Pulmonary exam normal ? ? ? ? ? ? ? Cardiovascular ?Exercise Tolerance: Good ?hypertension, Pt. on medications ?(-) angina(-) Past MI and (-) DOE Normal cardiovascular exam ? ? ?  ?Neuro/Psych ?negative neurological ROS ? negative psych ROS  ? GI/Hepatic ?negative GI ROS, Neg liver ROS,   ?Endo/Other  ?negative endocrine ROS ? Renal/GU ?  ? ?  ?Musculoskeletal ? ? Abdominal ?  ?Peds ? Hematology ?negative hematology ROS ?(+)   ?Anesthesia Other Findings ?Past Medical History: ?No date: Breast cancer ([place]) ?    Comment:  January 2000 left ?No date: Hypertension ? ?Past Surgical History: ?2004: ABDOMINAL HYSTERECTOMY ?No date: BREAST SURGERY ?2018: FRACTURE SURGERY ?    Comment:  right foot to correct knee ?2004: MASTECTOMY; Bilateral ?2018: TOTAL KNEE ARTHROPLASTY; Bilateral ?    Comment:  2018 left 2019 right ? ? ? ? Reproductive/Obstetrics ?negative OB ROS ? ?  ? ? ? ? ? ? ? ? ? ? ? ? ? ?  ?  ? ? ? ? ? ? ? ? ?Anesthesia Physical ? ?Anesthesia Plan ? ?ASA: 3 ? ?Anesthesia Plan: General  ? ?Post-op Pain Management: Minimal or no pain anticipated  ? ?Induction: Intravenous ? ?PONV Risk Score and Plan: 3 and Ondansetron, Treatment may vary due to age or medical condition, Propofol infusion and TIVA ? ?Airway Management Planned: Natural Airway and Nasal Cannula ? ?Additional Equipment: None ? ?Intra-op Plan:  ? ?Post-operative Plan:  ? ?Informed Consent: I have reviewed the patients History and  Physical, chart, labs and discussed the procedure including the risks, benefits and alternatives for the proposed anesthesia with the patient or authorized representative who has indicated his/her understanding and acceptance.  ? ? ? ?Dental Advisory Given ? ?Plan Discussed with: Anesthesiologist, CRNA and Surgeon ? ?Anesthesia Plan Comments: (Patient consented for risks of anesthesia including but not limited to:  ?- adverse reactions to medications ?- risk of airway placement if required ?- damage to eyes, teeth, lips or other oral mucosa ?- nerve damage due to positioning  ?- sore throat or hoarseness ?- Damage to heart, brain, nerves, lungs, other parts of body or loss of life ? ?Patient voiced understanding.)  ? ? ? ? ? ? ?Anesthesia Quick Evaluation ? ?

## 2021-06-26 NOTE — Op Note (Signed)
[facility] ?Gastroenterology ?Patient Name: Jacqueline Bowman ?Procedure Date: 06/26/2021 10:06 AM ?MRN: [id number] ?Account #: [id number] ?Date of Birth: [birth year] ?Admit Type: Outpatient ?Age: 56 ?Room: [facility] ENDO ROOM 3 ?Gender: Female ?Note Status: Finalized ?Instrument Name: Colonoscope [id number] ?Procedure:             Colonoscopy ?Indications:           Screening for colorectal malignant neoplasm ?Providers:             Jonathon Bellows MD, MD ?Referring MD:          Dionne Bucy. Bacigalupo (Referring MD) ?Medicines:             Monitored Anesthesia Care ?Complications:         No immediate complications. ?Procedure:             Pre-Anesthesia Assessment: ?                       - Prior to the procedure, a History and Physical was  ?                       performed, and patient medications, allergies and  ?                       sensitivities were reviewed. The patient's tolerance  ?                       of previous anesthesia was reviewed. ?                       - The risks and benefits of the procedure and the  ?                       sedation options and risks were discussed with the  ?                       patient. All questions were answered and informed  ?                       consent was obtained. ?                       - The risks and benefits of the procedure and the  ?                       sedation options and risks were discussed with the  ?                       patient. All questions were answered and informed  ?                       consent was obtained. ?                       - ASA Grade Assessment: II - A patient with mild  ?                       systemic disease. ?                       After obtaining  informed consent, the colonoscope was  ?                       passed under direct vision. Throughout the procedure,  ?                       the patient's blood pressure, pulse, and oxygen  ?                       saturations were monitored continuously. The  ?                        Colonoscope was introduced through the anus and  ?                       advanced to the the cecum, identified by the  ?                       appendiceal orifice. The colonoscopy was performed  ?                       with ease. The patient tolerated the procedure well.  ?                       The quality of the bowel preparation was excellent. ?Findings: ?     The perianal and digital rectal examinations were normal. ?     The entire examined colon appeared normal on direct and retroflexion  ?     views. ?Impression:            - The entire examined colon is normal on direct and  ?                       retroflexion views. ?                       - No specimens collected. ?Recommendation:        - Discharge patient to home (with escort). ?                       - Resume previous diet. ?                       - Continue present medications. ?                       - Repeat colonoscopy in 10 years for screening  ?                       purposes. ?Procedure Code(s):     --- Professional --- ?                       [phone], Colonoscopy, flexible; diagnostic, including  ?                       collection of specimen(s) by brushing or washing, when  ?                       performed (separate procedure) ?Diagnosis Code(s):     --- Professional --- ?  Z12.11, Encounter for screening for malignant neoplasm  ?                       of colon ?CPT copyright 2019 American Medical Association. All rights reserved. ?The codes documented in this report are preliminary and upon coder review may  ?be revised to meet current compliance requirements. ?Jonathon Bellows, MD ?Jonathon Bellows MD, MD ?06/26/2021 10:26:05 AM ?This report has been signed electronically. ?Number of Addenda: 0 ?Note Initiated On: 06/26/2021 10:06 AM ?Scope Withdrawal Time: 0 hours 9 minutes 0 seconds  ?Total Procedure Duration: 0 hours 11 minutes 30 seconds  ?Estimated Blood Loss:  Estimated blood loss: none. ?     [facility] ?

## 2021-06-26 NOTE — Anesthesia Procedure Notes (Signed)
Date/Time: 06/26/2021 10:08 AM ?Performed by: Johnna Acosta, CRNA ?Pre-anesthesia Checklist: Patient identified, Emergency Drugs available, Suction available, Patient being monitored and Timeout performed ?Patient Re-evaluated:Patient Re-evaluated prior to induction ?Oxygen Delivery Method: Nasal cannula ?Preoxygenation: Pre-oxygenation with 100% oxygen ?Induction Type: IV induction ? ? ? ? ?

## 2021-06-26 NOTE — Transfer of Care (Signed)
Immediate Anesthesia Transfer of Care Note ? ?Patient: Jacqueline Bowman ? ?Procedure(s) Performed: COLONOSCOPY WITH PROPOFOL ? ?Patient Location: PACU ? ?Anesthesia Type:General ? ?Level of Consciousness: awake and drowsy ? ?Airway & Oxygen Therapy: Patient Spontanous Breathing ? ?Post-op Assessment: Report given to RN and Post -op Vital signs reviewed and stable ? ?Post vital signs: Reviewed and stable ? ?Last Vitals:  ?Vitals Value Taken Time  ?BP 92/54 06/26/21 1031  ?Temp    ?Pulse 77 06/26/21 1031  ?Resp 21 06/26/21 1031  ?SpO2 100 % 06/26/21 1031  ? ? ?Last Pain:  ?Vitals:  ? 06/26/21 0856  ?TempSrc: Temporal  ?   ? ?  ? ?Complications: No notable events documented. ?

## 2021-06-28 NOTE — Progress Notes (Signed)
Non-identified Voicemail.  No Message left (voicemail not set up). ?

## 2021-06-29 ENCOUNTER — Encounter: Payer: Self-pay | Admitting: Gastroenterology

## 2021-07-27 ENCOUNTER — Encounter: Payer: Self-pay | Admitting: Family Medicine

## 2021-08-19 DIAGNOSIS — Z79899 Other long term (current) drug therapy: Secondary | ICD-10-CM | POA: Diagnosis not present

## 2021-08-19 DIAGNOSIS — M1991 Primary osteoarthritis, unspecified site: Secondary | ICD-10-CM | POA: Diagnosis not present

## 2021-08-19 DIAGNOSIS — M19071 Primary osteoarthritis, right ankle and foot: Secondary | ICD-10-CM | POA: Diagnosis not present

## 2021-08-19 DIAGNOSIS — Z96653 Presence of artificial knee joint, bilateral: Secondary | ICD-10-CM | POA: Diagnosis not present

## 2021-08-19 DIAGNOSIS — T8140XA Infection following a procedure, unspecified, initial encounter: Secondary | ICD-10-CM | POA: Diagnosis not present

## 2021-08-19 DIAGNOSIS — K5900 Constipation, unspecified: Secondary | ICD-10-CM | POA: Diagnosis not present

## 2021-08-19 DIAGNOSIS — S9031XA Contusion of right foot, initial encounter: Secondary | ICD-10-CM | POA: Diagnosis not present

## 2021-08-19 DIAGNOSIS — M25571 Pain in right ankle and joints of right foot: Secondary | ICD-10-CM | POA: Diagnosis not present

## 2021-08-19 DIAGNOSIS — R252 Cramp and spasm: Secondary | ICD-10-CM | POA: Diagnosis not present

## 2021-08-19 DIAGNOSIS — K5903 Drug induced constipation: Secondary | ICD-10-CM | POA: Diagnosis not present

## 2021-08-19 DIAGNOSIS — G8918 Other acute postprocedural pain: Secondary | ICD-10-CM | POA: Diagnosis not present

## 2021-09-18 NOTE — Progress Notes (Deleted)
Complete physical exam   Patient: Jacqueline Bowman   DOB: 12/02/1965   56 y.o. Female  MRN: [id number] Visit Date: 10/02/2021  Today's healthcare provider: [name], FNP   No chief complaint on file.  Subjective    Jacqueline Bowman is a 56 y.o. female who presents today for a complete physical exam.  She reports consuming a {diet types:17450} diet. {Exercise:19826} She generally feels {well/fairly well/poorly:18703}. She reports sleeping {well/fairly well/poorly:18703}. She {does/does not:200015} have additional problems to discuss today.  HPI  ***  Past Medical History:  Diagnosis Date   Breast cancer [facility])    January 2000 left   Hypertension    Past Surgical History:  Procedure Laterality Date   ABDOMINAL HYSTERECTOMY  2004   BREAST SURGERY     COLONOSCOPY WITH PROPOFOL N/A 06/26/2021   Procedure: COLONOSCOPY WITH PROPOFOL;  Surgeon: Jonathon Bellows, MD;  Location: [facility] ENDOSCOPY;  Service: Gastroenterology;  Laterality: N/A;   FOREIGN BODY REMOVAL N/A 04/18/2020   Procedure: FOREIGN BODY REMOVAL ADULT, exploration of umbilicus;  Surgeon: Fredirick Maudlin, MD;  Location: [facility] ORS;  Service: General;  Laterality: N/A;   FRACTURE SURGERY  2018   right foot to correct knee   MASTECTOMY Bilateral 2004   TOTAL KNEE ARTHROPLASTY Bilateral 2018   2018 left 2019 right   Social History   Socioeconomic History   Marital status: Legally Separated    Spouse name: Not on file   Number of children: 1   Years of education: Not on file   Highest education level: Not on file  Occupational History   Occupation: disabled  Tobacco Use   Smoking status: Former    Types: Cigarettes   Smokeless tobacco: Never   Tobacco comments:    socailly smoking for a few years in the distant past  Vaping Use   Vaping Use: Never used  Substance and Sexual Activity   Alcohol use: Not Currently    Comment: socially   Drug use: No   Sexual activity: Not Currently  Other Topics Concern   Not on  file  Social History Narrative   Not on file   Social Determinants of Health   Financial Resource Strain: Low Risk  (06/09/2021)   Overall Financial Resource Strain (CARDIA)    Difficulty of Paying Living Expenses: Not hard at all  Food Insecurity: No Food Insecurity (06/09/2021)   Hunger Vital Sign    Worried About Running Out of Food in the Last Year: Never true    [place] in the Last Year: Never true  Transportation Needs: No Transportation Needs (06/09/2021)   PRAPARE - Hydrologist (Medical): No    Lack of Transportation (Non-Medical): No  Physical Activity: Sufficiently Active (06/09/2021)   Exercise Vital Sign    Days of Exercise per Week: 4 days    Minutes of Exercise per Session: 40 min  Stress: No Stress Concern Present (06/09/2021)   [place]    Feeling of Stress : Only a little  Social Connections: Moderately Integrated (06/09/2021)   Social Connection and Isolation Panel [NHANES]    Frequency of Communication with Friends and Family: More than three times a week    Frequency of Social Gatherings with Friends and Family: More than three times a week    Attends Religious Services: More than 4 times per year    Active Member of [employer] or Organizations: Yes  Attends Club or Organization Meetings: 1 to 4 times per year    Marital Status: Separated  Intimate Partner Violence: Not At Risk (06/09/2021)   Humiliation, Afraid, Rape, and Kick questionnaire    Fear of Current or Ex-Partner: No    Emotionally Abused: No    Physically Abused: No    Sexually Abused: No   Family Status  Relation Name Status   Mother  Deceased at age 56       breast cancer   Father  Deceased   Sister  Web designer  Alive   Son  [place]   Family History  Problem Relation Age of Onset   Breast cancer Mother 68   Hypertension Father    Hyperlipidemia Father    Obesity  Father    Colon cancer Brother 77   Heart disease Brother    No Known Allergies  Patient Care Team: Bacigalupo, Dionne Bucy, MD as PCP - General (Family Medicine) Rico Junker, RN as Registered Nurse Theodore Demark, RN (Inactive) as Registered Nurse   Medications: Outpatient Medications Prior to Visit  Medication Sig   cetirizine (ZYRTEC) 10 MG tablet TAKE 1 TABLET BY MOUTH EVERY DAY   diclofenac Sodium (VOLTAREN) 1 % GEL Apply 1 application topically daily as needed (pain).   FLULAVAL QUADRIVALENT 0.5 ML injection    fluticasone (FLONASE) 50 MCG/ACT nasal spray Place into the nose.   ibuprofen (ADVIL) 600 MG tablet Take 1 tablet (600 mg total) by mouth every 6 (six) hours as needed.   MODERNA COVID-19 BIVAL BOOSTER 50 MCG/0.5ML injection    Multiple Vitamins-Minerals (MULTIVITAMIN WITH MINERALS) tablet Take 1 tablet by mouth daily. Complete   oxyCODONE (OXY IR/ROXICODONE) 5 MG immediate release tablet TAKE 1 TO 2 TABLETS BY MOUTH TWICE A DAY AS NEEDED   oxyCODONE-acetaminophen (PERCOCET) 10-325 MG tablet Take 1 tablet by mouth every 4 (four) hours as needed for pain.   topiramate (TOPAMAX) 50 MG tablet Take 50 mg by mouth daily.   No facility-administered medications prior to visit.    Review of Systems  {Labs  Heme  Chem  Endocrine  Serology  Results Review (optional):23779}  Objective    There were no vitals taken for this visit. {Show previous vital signs (optional):23777}   Physical Exam  ***  Last depression screening scores    06/09/2021   11:01 AM 04/23/2021   11:43 AM 07/24/2020    2:01 PM  PHQ 2/9 Scores  PHQ - 2 Score 0 2 1  PHQ- 9 Score 0 2 4   Last fall risk screening    06/09/2021   11:03 AM  [place] in the past year? 0  Number falls in past yr: 0  Injury with Fall? 0  Risk for fall due to : No Fall Risks  Follow up Falls evaluation completed   Last Audit-C alcohol use screening    06/09/2021   11:00 AM  Alcohol Use Disorder Test  (AUDIT)  1. How often do you have a drink containing alcohol? 0  2. How many drinks containing alcohol do you have on a typical day when you are drinking? 0  3. How often do you have six or more drinks on one occasion? 0  AUDIT-C Score 0   A score of 3 or more in women, and 4 or more in men indicates increased risk for alcohol abuse, EXCEPT if all of the points are from question  1   No results found for any visits on 10/02/21.  Assessment & Plan    Routine Health Maintenance and Physical Exam  Exercise Activities and Dietary recommendations  Goals      DIET - EAT MORE FRUITS AND VEGETABLES        Immunization History  Administered Date(s) Administered   Influenza-Unspecified 03/09/2021   Moderna Covid-19 Vaccine Bivalent Booster 72yr & up 03/09/2021   Moderna Sars-Covid-2 Vaccination 05/31/2019, 07/04/2019, 12/14/2019   Tdap 05/15/2010    Health Maintenance  Topic Date Due   Zoster Vaccines- Shingrix (1 of 2) Never done   PAP SMEAR-Modifier  Never done   TETANUS/TDAP  05/15/2020   INFLUENZA VACCINE  10/20/2021   MAMMOGRAM  05/22/2022   COLONOSCOPY (Pts [phone] coverage will need to be confirmed)  06/27/2031   COVID-19 Vaccine  Completed   Hepatitis C Screening  Completed   HIV Screening  Completed   HPV VACCINES  Aged Out    Discussed health benefits of physical activity, and encouraged her to engage in regular exercise appropriate for her age and condition.  ***  No follow-ups on file.     {provider attestation***:1}   ElGwyneth [phone]) [phone])  [place]

## 2021-10-02 ENCOUNTER — Encounter: Payer: Medicare Other | Admitting: Family Medicine

## 2021-10-14 DIAGNOSIS — K5900 Constipation, unspecified: Secondary | ICD-10-CM | POA: Diagnosis not present

## 2021-10-14 DIAGNOSIS — Z96659 Presence of unspecified artificial knee joint: Secondary | ICD-10-CM | POA: Diagnosis not present

## 2021-10-14 DIAGNOSIS — M19079 Primary osteoarthritis, unspecified ankle and foot: Secondary | ICD-10-CM | POA: Diagnosis not present

## 2021-10-14 DIAGNOSIS — M79671 Pain in right foot: Secondary | ICD-10-CM | POA: Diagnosis not present

## 2021-10-14 DIAGNOSIS — G894 Chronic pain syndrome: Secondary | ICD-10-CM | POA: Diagnosis not present

## 2021-10-14 DIAGNOSIS — M25561 Pain in right knee: Secondary | ICD-10-CM | POA: Diagnosis not present

## 2021-10-14 DIAGNOSIS — M1991 Primary osteoarthritis, unspecified site: Secondary | ICD-10-CM | POA: Diagnosis not present

## 2021-10-14 DIAGNOSIS — M1711 Unilateral primary osteoarthritis, right knee: Secondary | ICD-10-CM | POA: Diagnosis not present

## 2021-10-14 DIAGNOSIS — Z79899 Other long term (current) drug therapy: Secondary | ICD-10-CM | POA: Diagnosis not present

## 2021-10-14 DIAGNOSIS — S9031XA Contusion of right foot, initial encounter: Secondary | ICD-10-CM | POA: Diagnosis not present

## 2021-10-14 DIAGNOSIS — K5903 Drug induced constipation: Secondary | ICD-10-CM | POA: Diagnosis not present

## 2021-11-19 ENCOUNTER — Ambulatory Visit
Admission: EM | Admit: 2021-11-19 | Discharge: 2021-11-19 | Disposition: A | Discharge status: Home / Self Care | Payer: Medicare Other | Attending: Urgent Care | Admitting: Urgent Care

## 2021-11-19 DIAGNOSIS — R21 Rash and other nonspecific skin eruption: Secondary | ICD-10-CM | POA: Diagnosis not present

## 2021-11-19 MED ORDER — PREDNISONE 10 MG PO TABS: ORAL_TABLET | ORAL | 0 refills | Status: DC

## 2021-11-19 NOTE — ED Provider Notes (Signed)
[place]   [id number] 11/19/21 Arrival Time: 2440  ASSESSMENT & PLAN:  1. Rash and nonspecific skin eruption    Diffuse presentation of papular rash of unknown etiology. Exquisitely pruritic. Will treat with steroid taper. The patient has used corticosteroids in the past for treatment of cancer diagnoses, and familiar with potential side effects. She denies any past adverse effects associated with prednisone,  Meds ordered this encounter  Medications   predniSONE (DELTASONE) 10 MG tablet    Sig: Take 4 tablets (40 mg) for four days, then 2 tablets (20 mg) for four days, then 1 tablet (10 mg) for four days.    Dispense:  28 tablet    Refill:  0    Order Specific Question:   Supervising Provider    Answer:   Chase Picket [id number]    Will f/u with PCP if not seeing improvement over the next 2 weeks.  Will follow up with PCP or here if worsening or failing to improve as anticipated.  Reviewed expectations re: course of current medical issues. Questions answered.  Outlined signs and symptoms indicating need for more acute intervention.  Patient verbalized understanding. After Visit Summary given.   SUBJECTIVE:  Jacqueline Bowman is a 56 y.o. female who presents with a skin complaint.   Went fishing about 2 weeks ago and thinks she may have gotten into poison ivy and oak. Also contact with friends who were staying over at the house  Location: bilateral arms Onset: abrupt Duration: 2 weeks Associated pruritis? significant Associated pain? none Progression: stable  Drainage? none Known trigger? No  New soaps/lotions/topicals/detergents? No  Environmental exposures?  Environmental contact in the timeframe of symptoms Contacts with similar? No  Recent travel? No  Other associated symptoms: none Therapies tried thus far: calamine, alcohol, neosporin, benadryl Arthralgia or myalgia? none Recent illness? none Fever? none New medications? none No specific  aggravating or alleviating factors reported.  ROS: As per HPI.  OBJECTIVE: Vitals:   11/19/21 1314 11/19/21 1325  BP: 129/80   Pulse: 80   Resp: 18   Temp: 97.9 F (36.6 C)   SpO2: 98%   Weight:  174 lb (78.9 kg)  Height:  '5\' 5"'$  (1.651 m)    General appearance: alert; no distress HEENT: [place]; AT Neck: supple with FROM Lungs: clear to auscultation bilaterally Heart: regular rate and rhythm Extremities: no edema; moves all extremities normally Skin: warm and dry; signs of infection: yes; arm(s) bilateral; diffuse and papular multiple area(s) of dark raised. papules;papules distribution: outer surface of bilateral forearm Psychological: alert and cooperative; normal mood and affect  No Known Allergies  Past Medical History:  Diagnosis Date   Breast cancer ([place])    January 2000 left   Hypertension    Social History   Socioeconomic History   Marital status: Legally Separated    Spouse name: Not on file   Number of children: 1   Years of education: Not on file   Highest education level: Not on file  Occupational History   Occupation: disabled  Tobacco Use   Smoking status: Former    Types: Cigarettes   Smokeless tobacco: Never   Tobacco comments:    socailly smoking for a few years in the distant past  Vaping Use   Vaping Use: Never used  Substance and Sexual Activity   Alcohol use: Not Currently    Comment: socially   Drug use: No   Sexual activity: Not Currently  Other Topics Concern  Not on file  Social History Narrative   Not on file   Social Determinants of Health   Financial Resource Strain: Low Risk  (06/09/2021)   Overall Financial Resource Strain (CARDIA)    Difficulty of Paying Living Expenses: Not hard at all  Food Insecurity: No Food Insecurity (06/09/2021)   Hunger Vital Sign    Worried About Running Out of Food in the Last Year: Never true    Ran Out of Food in the Last Year: Never true  Transportation Needs: No Transportation Needs  (06/09/2021)   PRAPARE - Hydrologist (Medical): No    Lack of Transportation (Non-Medical): No  Physical Activity: Sufficiently Active (06/09/2021)   Exercise Vital Sign    Days of Exercise per Week: 4 days    Minutes of Exercise per Session: 40 min  Stress: No Stress Concern Present (06/09/2021)   [place]    Feeling of Stress : Only a little  Social Connections: Moderately Integrated (06/09/2021)   Social Connection and Isolation Panel [NHANES]    Frequency of Communication with Friends and Family: More than three times a week    Frequency of Social Gatherings with Friends and Family: More than three times a week    Attends Religious Services: More than 4 times per year    Active Member of Clubs or Organizations: Yes    Attends Archivist Meetings: 1 to 4 times per year    Marital Status: Separated  Intimate Partner Violence: Not At Risk (06/09/2021)   Humiliation, Afraid, Rape, and Kick questionnaire    Fear of Current or Ex-Partner: No    Emotionally Abused: No    Physically Abused: No    Sexually Abused: No   Family History  Problem Relation Age of Onset   Breast cancer Mother 36   Hypertension Father    Hyperlipidemia Father    Obesity Father    Colon cancer Brother 61   Heart disease Brother    Past Surgical History:  Procedure Laterality Date   ABDOMINAL HYSTERECTOMY  2004   BREAST SURGERY     COLONOSCOPY WITH PROPOFOL N/A 06/26/2021   Procedure: COLONOSCOPY WITH PROPOFOL;  Surgeon: Jonathon Bellows, MD;  Location: [facility] ENDOSCOPY;  Service: Gastroenterology;  Laterality: N/A;   FOREIGN BODY REMOVAL N/A 04/18/2020   Procedure: FOREIGN BODY REMOVAL ADULT, exploration of umbilicus;  Surgeon: Fredirick Maudlin, MD;  Location: [place] [facility];  Service: General;  Laterality: N/A;   FRACTURE SURGERY  2018   right foot to correct knee   MASTECTOMY Bilateral 2004   TOTAL KNEE  ARTHROPLASTY Bilateral 2018   2018 left 2019 right      ImmordinoAnnie Main, [place] 11/19/21 1356

## 2021-11-19 NOTE — ED Triage Notes (Addendum)
Patient to Urgent Care with complaints of a rash that developed 2 weeks ago. Describes rash as itchy and burning- present on bilateral forearms.  Patient reports that she has applied neosporin, bug spray, calamine lotion with no relief. Has been taking allergy pills. Unable to sleep due to the discomfort.   No change in detergent or lotions. Does report that she and a friend stay over in her extra bedroom and they may have shared a blanket. Patient also went fishing prior to rash onset.  Recently had a shingles shot.

## 2021-11-25 DIAGNOSIS — R252 Cramp and spasm: Secondary | ICD-10-CM | POA: Diagnosis not present

## 2021-11-25 DIAGNOSIS — G894 Chronic pain syndrome: Secondary | ICD-10-CM | POA: Diagnosis not present

## 2021-11-25 DIAGNOSIS — Z96659 Presence of unspecified artificial knee joint: Secondary | ICD-10-CM | POA: Diagnosis not present

## 2021-11-25 DIAGNOSIS — M25561 Pain in right knee: Secondary | ICD-10-CM | POA: Diagnosis not present

## 2021-11-25 DIAGNOSIS — M79671 Pain in right foot: Secondary | ICD-10-CM | POA: Diagnosis not present

## 2021-11-25 DIAGNOSIS — Z79899 Other long term (current) drug therapy: Secondary | ICD-10-CM | POA: Diagnosis not present

## 2021-11-25 DIAGNOSIS — M1991 Primary osteoarthritis, unspecified site: Secondary | ICD-10-CM | POA: Diagnosis not present

## 2021-11-25 DIAGNOSIS — S9031XA Contusion of right foot, initial encounter: Secondary | ICD-10-CM | POA: Diagnosis not present

## 2021-11-25 DIAGNOSIS — M1711 Unilateral primary osteoarthritis, right knee: Secondary | ICD-10-CM | POA: Diagnosis not present

## 2021-11-25 DIAGNOSIS — K5903 Drug induced constipation: Secondary | ICD-10-CM | POA: Diagnosis not present

## 2021-11-25 DIAGNOSIS — G8918 Other acute postprocedural pain: Secondary | ICD-10-CM | POA: Diagnosis not present

## 2022-01-01 NOTE — Progress Notes (Unsigned)
I,Patric Vanpelt S Bj Morlock,acting as a Education administrator for Lavon Paganini, MD.,have documented all relevant documentation on the behalf of Lavon Paganini, MD,as directed by  Lavon Paganini, MD while in the presence of Lavon Paganini, MD.    Complete physical exam   Patient: Jacqueline Bowman   DOB: 1966-01-04   56 y.o. Female  MRN: [id number] Visit Date: 01/04/2022  Today's healthcare provider: Lavon Paganini, MD   Chief Complaint  Patient presents with   Annual Exam   Subjective    Jacqueline Bowman is a 56 y.o. female who presents today for a complete physical exam.  She reports consuming a general diet.  walking  She generally feels well. She reports sleeping fairly well. She does not have additional problems to discuss today.  HPI    Past Medical History:  Diagnosis Date   Breast cancer [facility])    January 2000 left   Hypertension    Past Surgical History:  Procedure Laterality Date   ABDOMINAL HYSTERECTOMY  2004   BREAST SURGERY     COLONOSCOPY WITH PROPOFOL N/A 06/26/2021   Procedure: COLONOSCOPY WITH PROPOFOL;  Surgeon: Jonathon Bellows, MD;  Location: [facility] ENDOSCOPY;  Service: Gastroenterology;  Laterality: N/A;   FOREIGN BODY REMOVAL N/A 04/18/2020   Procedure: FOREIGN BODY REMOVAL ADULT, exploration of umbilicus;  Surgeon: Fredirick Maudlin, MD;  Location: [facility] ORS;  Service: General;  Laterality: N/A;   FRACTURE SURGERY  2018   right foot to correct knee   MASTECTOMY Bilateral 2004   TOTAL KNEE ARTHROPLASTY Bilateral 2018   2018 left 2019 right   Social History   Socioeconomic History   Marital status: Legally Separated    Spouse name: Not on file   Number of children: 1   Years of education: Not on file   Highest education level: Not on file  Occupational History   Occupation: disabled  Tobacco Use   Smoking status: Former    Types: Cigarettes   Smokeless tobacco: Never   Tobacco comments:    socailly smoking for a few years in the distant past  Vaping Use    Vaping Use: Never used  Substance and Sexual Activity   Alcohol use: Not Currently    Comment: socially   Drug use: No   Sexual activity: Not Currently  Other Topics Concern   Not on file  Social History Narrative   Not on file   Social Determinants of Health   Financial Resource Strain: Low Risk  (06/09/2021)   Overall Financial Resource Strain (CARDIA)    Difficulty of Paying Living Expenses: Not hard at all  Food Insecurity: No Food Insecurity (06/09/2021)   Hunger Vital Sign    Worried About Running Out of Food in the Last Year: Never true    [place] in the Last Year: Never true  Transportation Needs: No Transportation Needs (06/09/2021)   PRAPARE - Hydrologist (Medical): No    Lack of Transportation (Non-Medical): No  Physical Activity: Sufficiently Active (06/09/2021)   Exercise Vital Sign    Days of Exercise per Week: 4 days    Minutes of Exercise per Session: 40 min  Stress: No Stress Concern Present (06/09/2021)   [place]    Feeling of Stress : Only a little  Social Connections: Moderately Integrated (06/09/2021)   Social Connection and Isolation Panel [NHANES]    Frequency of Communication with Friends and Family: More than three times  a week    Frequency of Social Gatherings with Friends and Family: More than three times a week    Attends Religious Services: More than 4 times per year    Active Member of Clubs or Organizations: Yes    Attends Archivist Meetings: 1 to 4 times per year    Marital Status: Separated  Intimate Partner Violence: Not At Risk (06/09/2021)   Humiliation, Afraid, Rape, and Kick questionnaire    Fear of Current or Ex-Partner: No    Emotionally Abused: No    Physically Abused: No    Sexually Abused: No   Family Status  Relation Name Status   Mother  Deceased at age 29       breast cancer   Father  Deceased   Sister  Development worker, international aid  Alive   Son  Alive   Brother  Alive   Family History  Problem Relation Age of Onset   Breast cancer Mother 60   Hypertension Father    Hyperlipidemia Father    Obesity Father    Colon cancer Brother 53   Heart disease Brother    No Known Allergies  Patient Care Team: Bacigalupo, Dionne Bucy, MD as PCP - General (Family Medicine) Rico Junker, RN as Registered Nurse Theodore Demark, RN (Inactive) as Registered Nurse   Medications: Outpatient Medications Prior to Visit  Medication Sig   cetirizine (ZYRTEC) 10 MG tablet TAKE 1 TABLET BY MOUTH EVERY DAY   diclofenac Sodium (VOLTAREN) 1 % GEL Apply 1 application topically daily as needed (pain).   fluticasone (FLONASE) 50 MCG/ACT nasal spray Place into the nose.   ibuprofen (ADVIL) 600 MG tablet Take 1 tablet (600 mg total) by mouth every 6 (six) hours as needed.   Multiple Vitamins-Minerals (MULTIVITAMIN WITH MINERALS) tablet Take 1 tablet by mouth daily. Complete   oxyCODONE (OXY IR/ROXICODONE) 5 MG immediate release tablet TAKE 1 TO 2 TABLETS BY MOUTH TWICE A DAY AS NEEDED   oxyCODONE-acetaminophen (PERCOCET) 10-325 MG tablet Take 1 tablet by mouth every 4 (four) hours as needed for pain.   topiramate (TOPAMAX) 50 MG tablet Take 50 mg by mouth daily.   [DISCONTINUED] FLULAVAL QUADRIVALENT 0.5 ML injection  (Patient not taking: Reported on 01/04/2022)   [DISCONTINUED] MODERNA COVID-19 BIVAL BOOSTER 50 MCG/0.5ML injection  (Patient not taking: Reported on 01/04/2022)   [DISCONTINUED] predniSONE (DELTASONE) 10 MG tablet Take 4 tablets (40 mg) for four days, then 2 tablets (20 mg) for four days, then 1 tablet (10 mg) for four days. (Patient not taking: Reported on 01/04/2022)   No facility-administered medications prior to visit.    Review of Systems  Musculoskeletal:  Positive for arthralgias.  Allergic/Immunologic: Positive for environmental allergies.  All other systems reviewed and are negative.     Objective     BP 114/81 (BP Location: Left Arm, Patient Position: Sitting, Cuff Size: Large)   Pulse 85   Temp 98 F (36.7 C) (Oral)   Resp 16   Ht '5\' 5"'$  (1.651 m)   Wt 174 lb 6.4 oz (79.1 kg)   BMI 29.02 kg/m     Physical Exam Vitals reviewed.  Constitutional:      General: She is not in acute distress.    Appearance: Normal appearance. She is well-developed. She is not diaphoretic.  HENT:     Head: Normocephalic and atraumatic.     Right Ear: Tympanic membrane, ear canal and external ear normal.  Left Ear: Tympanic membrane, ear canal and external ear normal.     Nose: Nose normal.     Mouth/Throat:     Mouth: Mucous membranes are moist.     Pharynx: Oropharynx is clear. No oropharyngeal exudate.  Eyes:     General: No scleral icterus.    Conjunctiva/sclera: Conjunctivae normal.     Pupils: Pupils are equal, round, and reactive to light.  Neck:     Thyroid: No thyromegaly.  Cardiovascular:     Rate and Rhythm: Normal rate and regular rhythm.     Pulses: Normal pulses.     Heart sounds: Normal heart sounds. No murmur heard. Pulmonary:     Effort: Pulmonary effort is normal. No respiratory distress.     Breath sounds: Normal breath sounds. No wheezing or rales.  Chest:     Comments: Breasts: breasts appear normal, no suspicious masses, no skin or nipple changes or axillary nodes, post-mastectomy site well healed and free of suspicious changes, surgical scars noted   Abdominal:     General: There is no distension.     Palpations: Abdomen is soft.     Tenderness: There is no abdominal tenderness.  Musculoskeletal:        General: No deformity.     Cervical back: Neck supple.     Right lower leg: No edema.     Left lower leg: No edema.  Lymphadenopathy:     Cervical: No cervical adenopathy.  Skin:    General: Skin is warm and dry.     Findings: No rash.  Neurological:     Mental Status: She is alert and oriented to person, place, and time. Mental status is at baseline.      Sensory: No sensory deficit.     Motor: No weakness.     Gait: Gait normal.  Psychiatric:        Mood and Affect: Mood normal.        Behavior: Behavior normal.        Thought Content: Thought content normal.       Last depression screening scores    01/04/2022    3:10 PM 06/09/2021   11:01 AM 04/23/2021   11:43 AM  PHQ 2/9 Scores  PHQ - 2 Score 2 0 2  PHQ- 9 Score 3 0 2   Last fall risk screening    01/04/2022    3:10 PM  Fall Risk   Falls in the past year? 0  Number falls in past yr: 0  Injury with Fall? 0  Risk for fall due to : No Fall Risks  Follow up Falls evaluation completed   Last Audit-C alcohol use screening    01/04/2022    3:10 PM  Alcohol Use Disorder Test (AUDIT)  1. How often do you have a drink containing alcohol? 0  2. How many drinks containing alcohol do you have on a typical day when you are drinking? 0  3. How often do you have six or more drinks on one occasion? 0  AUDIT-C Score 0   A score of 3 or more in women, and 4 or more in men indicates increased risk for alcohol abuse, EXCEPT if all of the points are from question 1   No results found for any visits on 01/04/22.  Assessment & Plan    Routine Health Maintenance and Physical Exam  Exercise Activities and Dietary recommendations  Goals      DIET - EAT MORE FRUITS AND VEGETABLES  Immunization History  Administered Date(s) Administered   Influenza-Unspecified 03/09/2021, 12/30/2021   Moderna Covid-19 Vaccine Bivalent Booster 69yr & up 03/09/2021   Moderna Sars-Covid-2 Vaccination 05/31/2019, 07/04/2019, 12/14/2019   Tdap 05/15/2010   Varicella 12/30/2021    Health Maintenance  Topic Date Due   Zoster Vaccines- Shingrix (1 of 2) Never done   TETANUS/TDAP  05/15/2020   COVID-19 Vaccine (5 - Moderna risk series) 05/04/2021   MAMMOGRAM  05/22/2022   COLONOSCOPY (Pts 45-447yrInsurance coverage will need to be confirmed)  06/27/2031   INFLUENZA VACCINE  Completed    Hepatitis C Screening  Completed   HIV Screening  Completed   HPV VACCINES  Aged Out    Discussed health benefits of physical activity, and encouraged her to engage in regular exercise appropriate for her age and condition.  Problem List Items Addressed This Visit       Cardiovascular and Mediastinum   Essential hypertension    Well-controlled Diet controlled Recheck metabolic panel      Relevant Orders   CBC   Hemoglobin A1c   Comprehensive metabolic panel   Lipid panel     Other   History of breast cancer    Patient with left breast cancer in 2000 status post bilateral mastectomy with reconstruction with TRAM flap Repeat mammogram ordered today      Relevant Orders   MM 3D SCREEN BREAST BILATERAL   Overweight    Discussed importance of healthy weight management Discussed diet and exercise       Relevant Orders   CBC   Hemoglobin A1c   Comprehensive metabolic panel   Lipid panel   Other Visit Diagnoses     Encounter for annual physical exam    -  Primary   Relevant Orders   CBC   Hemoglobin A1c   Comprehensive metabolic panel   Lipid panel   Screening mammogram for breast cancer       Relevant Orders   MM 3D SCREEN BREAST BILATERAL   Hyperglycemia       Relevant Orders   Hemoglobin A1c        Return in about 1 year (around 01/05/2023) for CPE.     I, AnLavon PaganiniMD, have reviewed all documentation for this visit. The documentation on 01/04/22 for the exam, diagnosis, procedures, and orders are all accurate and complete.   Bacigalupo, AnDionne BucyMD, MPH [facility]

## 2022-01-04 ENCOUNTER — Ambulatory Visit (INDEPENDENT_AMBULATORY_CARE_PROVIDER_SITE_OTHER): Payer: Medicare Other | Admitting: Family Medicine

## 2022-01-04 ENCOUNTER — Encounter: Payer: Self-pay | Admitting: Family Medicine

## 2022-01-04 VITALS — BP 114/81 | HR 85 | Temp 98.0°F | Resp 16 | Ht 65.0 in | Wt 174.4 lb

## 2022-01-04 DIAGNOSIS — E663 Overweight: Secondary | ICD-10-CM | POA: Diagnosis not present

## 2022-01-04 DIAGNOSIS — Z1231 Encounter for screening mammogram for malignant neoplasm of breast: Secondary | ICD-10-CM

## 2022-01-04 DIAGNOSIS — Z853 Personal history of malignant neoplasm of breast: Secondary | ICD-10-CM | POA: Diagnosis not present

## 2022-01-04 DIAGNOSIS — Z0000 Encounter for general adult medical examination without abnormal findings: Secondary | ICD-10-CM | POA: Diagnosis not present

## 2022-01-04 DIAGNOSIS — I10 Essential (primary) hypertension: Secondary | ICD-10-CM | POA: Diagnosis not present

## 2022-01-04 DIAGNOSIS — R739 Hyperglycemia, unspecified: Secondary | ICD-10-CM

## 2022-01-04 NOTE — Assessment & Plan Note (Signed)
Discussed importance of healthy weight management Discussed diet and exercise  

## 2022-01-04 NOTE — Assessment & Plan Note (Signed)
Patient with left breast cancer in 2000 status post bilateral mastectomy with reconstruction with TRAM flap Repeat mammogram ordered today

## 2022-01-04 NOTE — Assessment & Plan Note (Signed)
Well-controlled Diet controlled Recheck metabolic panel

## 2022-01-05 DIAGNOSIS — I10 Essential (primary) hypertension: Secondary | ICD-10-CM | POA: Diagnosis not present

## 2022-01-05 DIAGNOSIS — Z0000 Encounter for general adult medical examination without abnormal findings: Secondary | ICD-10-CM | POA: Diagnosis not present

## 2022-01-05 DIAGNOSIS — R739 Hyperglycemia, unspecified: Secondary | ICD-10-CM | POA: Diagnosis not present

## 2022-01-06 LAB — CBC
Hematocrit: 40.6 % (ref 34.0–46.6)
Hemoglobin: 13 g/dL (ref 11.1–15.9)
MCH: 25.7 pg — ABNORMAL LOW (ref 26.6–33.0)
MCHC: 32 g/dL (ref 31.5–35.7)
MCV: 80 fL (ref 79–97)
Platelets: 192 10*3/uL (ref 150–450)
RBC: 5.05 x10E6/uL (ref 3.77–5.28)
RDW: 15.3 % (ref 11.7–15.4)
WBC: 4.3 10*3/uL (ref 3.4–10.8)

## 2022-01-06 LAB — COMPREHENSIVE METABOLIC PANEL
ALT: 15 IU/L (ref 0–32)
AST: 16 IU/L (ref 0–40)
Albumin/Globulin Ratio: 1.7 (ref 1.2–2.2)
Albumin: 4.5 g/dL (ref 3.8–4.9)
Alkaline Phosphatase: 96 IU/L (ref 44–121)
BUN/Creatinine Ratio: 13 (ref 9–23)
BUN: 12 mg/dL (ref 6–24)
Bilirubin Total: 0.2 mg/dL (ref 0.0–1.2)
CO2: 22 mmol/L (ref 20–29)
Calcium: 10.1 mg/dL (ref 8.7–10.2)
Chloride: 104 mmol/L (ref 96–106)
Creatinine, Ser: 0.94 mg/dL (ref 0.57–1.00)
Globulin, Total: 2.7 g/dL (ref 1.5–4.5)
Glucose: 87 mg/dL (ref 70–99)
Potassium: 4.5 mmol/L (ref 3.5–5.2)
Sodium: 142 mmol/L (ref 134–144)
Total Protein: 7.2 g/dL (ref 6.0–8.5)
eGFR: 70 mL/min/{1.73_m2} (ref >59)

## 2022-01-06 LAB — LIPID PANEL
Chol/HDL Ratio: 2.8 ratio (ref 0.0–4.4)
Cholesterol, Total: 270 mg/dL — ABNORMAL HIGH (ref 100–199)
HDL: 95 mg/dL (ref >39)
LDL Chol Calc (NIH): 163 mg/dL — ABNORMAL HIGH (ref 0–99)
Triglycerides: 74 mg/dL (ref 0–149)
VLDL Cholesterol Cal: 12 mg/dL (ref 5–40)

## 2022-01-06 LAB — HEMOGLOBIN A1C
Est. average glucose Bld gHb Est-mCnc: 120 mg/dL
Hgb A1c MFr Bld: 5.8 % — ABNORMAL HIGH (ref 4.8–5.6)

## 2022-01-07 ENCOUNTER — Telehealth: Payer: Self-pay | Admitting: Family Medicine

## 2022-01-07 NOTE — Telephone Encounter (Signed)
Patient aware per lab notes. See lab report.

## 2022-01-07 NOTE — Telephone Encounter (Signed)
Patient inquiring about lab results and would like a follow up call.

## 2022-02-04 ENCOUNTER — Telehealth: Payer: Self-pay

## 2022-02-04 DIAGNOSIS — Z79899 Other long term (current) drug therapy: Secondary | ICD-10-CM | POA: Diagnosis not present

## 2022-02-04 DIAGNOSIS — M79671 Pain in right foot: Secondary | ICD-10-CM | POA: Diagnosis not present

## 2022-02-04 DIAGNOSIS — M19071 Primary osteoarthritis, right ankle and foot: Secondary | ICD-10-CM | POA: Diagnosis not present

## 2022-02-04 DIAGNOSIS — G894 Chronic pain syndrome: Secondary | ICD-10-CM | POA: Diagnosis not present

## 2022-02-04 DIAGNOSIS — Z96653 Presence of artificial knee joint, bilateral: Secondary | ICD-10-CM | POA: Diagnosis not present

## 2022-02-04 DIAGNOSIS — M25561 Pain in right knee: Secondary | ICD-10-CM | POA: Diagnosis not present

## 2022-02-04 DIAGNOSIS — M1991 Primary osteoarthritis, unspecified site: Secondary | ICD-10-CM | POA: Diagnosis not present

## 2022-02-04 DIAGNOSIS — K5903 Drug induced constipation: Secondary | ICD-10-CM | POA: Diagnosis not present

## 2022-02-04 DIAGNOSIS — S9031XA Contusion of right foot, initial encounter: Secondary | ICD-10-CM | POA: Diagnosis not present

## 2022-02-04 DIAGNOSIS — M1711 Unilateral primary osteoarthritis, right knee: Secondary | ICD-10-CM | POA: Diagnosis not present

## 2022-02-04 DIAGNOSIS — K5900 Constipation, unspecified: Secondary | ICD-10-CM | POA: Diagnosis not present

## 2022-02-04 NOTE — Telephone Encounter (Signed)
Copied from [place] [phone]. Topic: General - Inquiry >> Feb 04, 2022  8:55 AM Penni Bombard wrote: Reason for CRM: Pt called saying she needs a diagnostic order for a mammogram because she told them when she was scheduling her mammogram she was having some breast pain.

## 2022-02-04 NOTE — Telephone Encounter (Signed)
We will need to have a visit to order the diagnostic mammo as they need more information on location etc. If this was already assessed then we can order without another appt.

## 2022-02-04 NOTE — Telephone Encounter (Signed)
Lmtcb to schedule appt as recommended by provider.

## 2022-02-05 ENCOUNTER — Ambulatory Visit: Payer: Medicare Other | Admitting: Family Medicine

## 2022-03-02 NOTE — Progress Notes (Deleted)
   I,Ruwayda Curet S Haidynn Almendarez,acting as a Education administrator for Lavon Paganini, MD.,have documented all relevant documentation on the behalf of Lavon Paganini, MD,as directed by  Lavon Paganini, MD while in the presence of Lavon Paganini, MD.     Established patient visit   Patient: Jacqueline Bowman   DOB: 01/02/1966   56 y.o. Female  MRN: [id number] Visit Date: 03/04/2022  Today's healthcare provider: Lavon Paganini, MD   No chief complaint on file.  Subjective    HPI  Patient C/O breast pain. Patient requesting a diagnostic order.   Medications: Outpatient Medications Prior to Visit  Medication Sig   cetirizine (ZYRTEC) 10 MG tablet TAKE 1 TABLET BY MOUTH EVERY DAY   diclofenac Sodium (VOLTAREN) 1 % GEL Apply 1 application topically daily as needed (pain).   fluticasone (FLONASE) 50 MCG/ACT nasal spray Place into the nose.   ibuprofen (ADVIL) 600 MG tablet Take 1 tablet (600 mg total) by mouth every 6 (six) hours as needed.   Multiple Vitamins-Minerals (MULTIVITAMIN WITH MINERALS) tablet Take 1 tablet by mouth daily. Complete   oxyCODONE (OXY IR/ROXICODONE) 5 MG immediate release tablet TAKE 1 TO 2 TABLETS BY MOUTH TWICE A DAY AS NEEDED   oxyCODONE-acetaminophen (PERCOCET) 10-325 MG tablet Take 1 tablet by mouth every 4 (four) hours as needed for pain.   topiramate (TOPAMAX) 50 MG tablet Take 50 mg by mouth daily.   No facility-administered medications prior to visit.    Review of Systems  {Labs  Heme  Chem  Endocrine  Serology  Results Review (optional):23779}   Objective    There were no vitals taken for this visit. {Show previous vital signs (optional):23777}  Physical Exam  ***  No results found for any visits on 03/04/22.  Assessment & Plan     ***  No follow-ups on file.      {provider attestation***:1}   Lavon Paganini, MD  [facility] [phone] (phone) [phone] (fax)  [place]

## 2022-03-04 ENCOUNTER — Ambulatory Visit: Payer: Medicare Other | Admitting: Family Medicine

## 2022-03-04 NOTE — Progress Notes (Signed)
I,Sulibeya S Dimas,acting as a Education administrator for Jacqueline Sprout, FNP.,have documented all relevant documentation on the behalf of Jacqueline Sprout, FNP,as directed by  Jacqueline Sprout, FNP while in the presence of Jacqueline Sprout, FNP.     Established patient visit   Patient: Jacqueline Bowman   DOB: 12-24-1965   56 y.o. Female  MRN: [id number] Visit Date: 03/05/2022  Today's healthcare provider: [name], FNP  Introduced to nurse practitioner role and practice setting.  All questions answered.  Discussed provider/patient relationship and expectations.  Chief Complaint  Patient presents with   Breast Pain   Subjective    HPI  Patient C/O having some breast pain. Patient requesting a diagnostic order for mammogram. Patient reports more discomfort on left than right. Patient denies any rash or discharge. Patient has hx of reconstruction.   Medications: Outpatient Medications Prior to Visit  Medication Sig   cetirizine (ZYRTEC) 10 MG tablet TAKE 1 TABLET BY MOUTH EVERY DAY   diclofenac Sodium (VOLTAREN) 1 % GEL Apply 1 application topically daily as needed (pain).   fluticasone (FLONASE) 50 MCG/ACT nasal spray Place into the nose.   ibuprofen (ADVIL) 600 MG tablet Take 1 tablet (600 mg total) by mouth every 6 (six) hours as needed.   Multiple Vitamins-Minerals (MULTIVITAMIN WITH MINERALS) tablet Take 1 tablet by mouth daily. Complete   oxyCODONE-acetaminophen (PERCOCET) 10-325 MG tablet Take 1 tablet by mouth every 4 (four) hours as needed for pain.   topiramate (TOPAMAX) 50 MG tablet Take 50 mg by mouth daily.   oxyCODONE (OXY IR/ROXICODONE) 5 MG immediate release tablet TAKE 1 TO 2 TABLETS BY MOUTH TWICE A DAY AS NEEDED (Patient not taking: Reported on 03/05/2022)   No facility-administered medications prior to visit.    Review of Systems  Constitutional:  Negative for chills and fever.  Respiratory:  Negative for shortness of breath.   Cardiovascular:  Negative for chest pain.   Gastrointestinal:  Negative for abdominal pain.  Skin:  Negative for color change and rash.     Objective    BP 108/75 (BP Location: Left Arm, Patient Position: Sitting, Cuff Size: Large)   Pulse 75   Temp 98.1 F (36.7 C) (Oral)   Resp 16   Wt 174 lb 12.8 oz (79.3 kg)   BMI 29.09 kg/m  BP Readings from Last 3 Encounters:  03/05/22 108/75  01/04/22 114/81  11/19/21 129/80   Wt Readings from Last 3 Encounters:  03/05/22 174 lb 12.8 oz (79.3 kg)  01/04/22 174 lb 6.4 oz (79.1 kg)  11/19/21 174 lb (78.9 kg)      Physical Exam Vitals and nursing note reviewed.  Constitutional:      General: She is not in acute distress.    Appearance: Normal appearance. She is overweight. She is not ill-appearing, toxic-appearing or diaphoretic.  HENT:     Head: Normocephalic and atraumatic.  Cardiovascular:     Rate and Rhythm: Normal rate and regular rhythm.     Pulses: Normal pulses.     Heart sounds: Normal heart sounds. No murmur heard.    No friction rub. No gallop.  Pulmonary:     Effort: Pulmonary effort is normal. No respiratory distress.     Breath sounds: Normal breath sounds. No stridor. No wheezing, rhonchi or rales.  Chest:     Chest wall: No tenderness.     Comments: Breasts: breasts appear normal, no suspicious masses, no skin or nipple changes or axillary nodes,  symmetric fibrous changes in both upper outer quadrants, surgical scars noted bilateral, risk and benefit of breast self-exam was discussed, 12-3 o' clock with internal burning/itching/tingling pain which has been ongoing, worsening since early/mid Oct following elbow to L chest wall at church  Abdominal:     General: Bowel sounds are normal.     Palpations: Abdomen is soft.  Musculoskeletal:        General: No swelling, tenderness, deformity or signs of injury. Normal range of motion.     Right lower leg: No edema.     Left lower leg: No edema.  Skin:    General: Skin is warm and dry.     Capillary Refill:  Capillary refill takes less than 2 seconds.     Coloration: Skin is not jaundiced or pale.     Findings: No bruising, erythema, lesion or rash.  Neurological:     General: No focal deficit present.     Mental Status: She is alert and oriented to person, place, and time. Mental status is at baseline.     Cranial Nerves: No cranial nerve deficit.     Sensory: No sensory deficit.     Motor: No weakness.     Coordination: Coordination normal.  Psychiatric:        Mood and Affect: Mood normal.        Behavior: Behavior normal.        Thought Content: Thought content normal.        Judgment: Judgment normal.    No results found for any visits on 03/05/22.  Assessment & Plan     Problem List Items Addressed This Visit       Other   Breast pain, left - Primary    L breast pain 12-3 o'clock Noted following injury in October prior to OV for CPE Variable worsening; not constant Favors that side Hx of breast cancer and deep flap from back Last known normal mammogram 2/23 Recommend [place] at this time; defer mammogram unless abnormalities noted on [place] Patient discussed with Drubel PA; follow up as needed      Relevant Orders   US BREAST COMPLETE UNI LEFT INC AXILLA   MM DIAG BREAST TOMO UNI LEFT   History of breast cancer   Relevant Orders   US BREAST COMPLETE UNI LEFT INC AXILLA   MM DIAG BREAST TOMO UNI LEFT   Return if symptoms worsen or fail to improve.     Vonna Kotyk, FNP, have reviewed all documentation for this visit. The documentation on 03/05/22 for the exam, diagnosis, procedures, and orders are all accurate and complete.  Jacqueline Bowman, [place] [phone] (phone) [phone] (fax)  [place]

## 2022-03-05 ENCOUNTER — Other Ambulatory Visit: Payer: Self-pay | Admitting: Family Medicine

## 2022-03-05 ENCOUNTER — Encounter: Payer: Self-pay | Admitting: Family Medicine

## 2022-03-05 ENCOUNTER — Ambulatory Visit (INDEPENDENT_AMBULATORY_CARE_PROVIDER_SITE_OTHER): Payer: Medicare Other | Admitting: Family Medicine

## 2022-03-05 VITALS — BP 108/75 | HR 75 | Temp 98.1°F | Resp 16 | Wt 174.8 lb

## 2022-03-05 DIAGNOSIS — N644 Mastodynia: Secondary | ICD-10-CM | POA: Diagnosis not present

## 2022-03-05 DIAGNOSIS — Z853 Personal history of malignant neoplasm of breast: Secondary | ICD-10-CM | POA: Diagnosis not present

## 2022-03-05 NOTE — Patient Instructions (Signed)
Please call  [facility]  [address], [place],  [place]  [address] Get Driving Directions Main: [phone]  Sunday:Closed Monday:7:20 AM - 5:00 PM Tuesday:7:20 AM - 5:00 PM Wednesday:7:20 AM - 5:00 PM Thursday:7:20 AM - 5:00 PM Friday:7:20 AM - 4:30 PM Saturday:Closed

## 2022-03-05 NOTE — Assessment & Plan Note (Signed)
L breast pain 12-3 o'clock Noted following injury in October prior to OV for CPE Variable worsening; not constant Favors that side Hx of breast cancer and deep flap from back Last known normal mammogram 2/23 Recommend [place] at this time; defer mammogram unless abnormalities noted on [place] Patient discussed with Drubel PA; follow up as needed

## 2022-03-09 ENCOUNTER — Ambulatory Visit
Admission: RE | Admit: 2022-03-09 | Discharge: 2022-03-09 | Disposition: A | Discharge status: Home / Self Care | Payer: Medicare Other | Source: Ambulatory Visit | Attending: Family Medicine | Admitting: Family Medicine

## 2022-03-09 DIAGNOSIS — N644 Mastodynia: Secondary | ICD-10-CM | POA: Insufficient documentation

## 2022-03-09 DIAGNOSIS — Z853 Personal history of malignant neoplasm of breast: Secondary | ICD-10-CM | POA: Insufficient documentation

## 2022-03-09 NOTE — Progress Notes (Signed)
Negative breast US.

## 2022-03-10 ENCOUNTER — Other Ambulatory Visit: Payer: Medicare Other

## 2022-03-25 ENCOUNTER — Other Ambulatory Visit: Payer: Medicare Other

## 2022-03-31 ENCOUNTER — Ambulatory Visit: Payer: Self-pay | Admitting: *Deleted

## 2022-03-31 NOTE — Telephone Encounter (Signed)
Message from Erick Blinks sent at 03/31/2022  9:46 AM EST  Summary: Cold symptoms, seeking a Rx   Pt called to report that she has cold symptoms; cough, sore throat, nasal congestion, sluggish. She says she does not want to come in for an appt, she just wants something called in for her that is stronger than what she has been taking OTC.  [id number] [place] [facility] [id number]: [phone]: [phone]         Call History   Type Contact Phone/Fax User  03/31/2022 09:45 AM EST Phone (Incoming) GAniyha, Tate(Self) [phone](Jerilynn Mages EMarlan [place]   Reason for Disposition  [1] Continuous (nonstop) coughing interferes with work or school AND [2] no improvement using cough treatment per Care Advice  Answer Assessment - Initial Assessment Questions 1. ONSET: "When did the cough begin?"      Thick mucus.    Christmas.    My grand kids were sick over Christmas.  I was exposed to them. 2. SEVERITY: "How bad is the cough today?"      Coughing up a little mucus.   Not know color.   3. SPUTUM: "Describe the color of your sputum" (none, dry cough; clear, white, yellow, green)     Not sure 4. HEMOPTYSIS: "Are you coughing up any blood?" If so ask: "How much?" (flecks, streaks, tablespoons, etc.)     Not asked 5. DIFFICULTY BREATHING: "Are you having difficulty breathing?" If Yes, ask: "How bad is it?" (e.g., mild, moderate, severe)    - MILD: No SOB at rest, mild SOB with walking, speaks normally in sentences, can lie down, no retractions, pulse < 100.    - MODERATE: SOB at rest, SOB with minimal exertion and prefers to sit, cannot lie down flat, speaks in phrases, mild retractions, audible wheezing, pulse 100-120.    - SEVERE: Very SOB at rest, speaks in single words, struggling to breathe, sitting hunched forward, retractions, pulse > 120      No chest pain.   A little shortness of breath but not all the time. 6. FEVER: "Do you  have a fever?" If Yes, ask: "What is your temperature, how was it measured, and when did it start?"     No 7. CARDIAC HISTORY: "Do you have any history of heart disease?" (e.g., heart attack, congestive heart failure)      No 8. LUNG HISTORY: "Do you have any history of lung disease?"  (e.g., pulmonary embolus, asthma, emphysema)     No   No diabetes. 9. PE RISK FACTORS: "Do you have a history of blood clots?" (or: recent major surgery, recent prolonged travel, bedridden)     I'm a cancer survivor.     10. OTHER SYMPTOMS: "Do you have any other symptoms?" (e.g., runny nose, wheezing, chest pain)       Coughing, sore throat congestion, tired. 11. PREGNANCY: "Is there any chance you are pregnant?" "When was your last menstrual period?"       N/A 12. TRAVEL: "Have you traveled out of the country in the last month?" (e.g., travel history, exposures)       Exposed to sick family members  Protocols used: Cough - Acute Productive-A-AH

## 2022-03-31 NOTE — Telephone Encounter (Signed)
  Chief Complaint: Coughing, sore throat, nasal congestion and fatigue since Christmas Day.   She was exposed to sick grand kids. Symptoms: above Frequency: Started Christmas Day.   Whole family been sick Pertinent Negatives: Patient denies underlying risk factors. Disposition: '[]'$ ED /'[]'$ Urgent Care (no appt availability in office) / '[x]'$ Appointment(In office/virtual)/ '[]'$  [facility]/ '[]'$ Home Care/ '[]'$ Refused Recommended Disposition /'[]'$ [facility]/ '[]'$  Follow-up with PCP Additional Notes: Appt. Made with Dr. Brita Romp for 04/01/2022 at 3:00.

## 2022-04-01 ENCOUNTER — Ambulatory Visit: Payer: Medicare Other | Admitting: Family Medicine

## 2022-04-01 DIAGNOSIS — M25561 Pain in right knee: Secondary | ICD-10-CM | POA: Diagnosis not present

## 2022-04-01 DIAGNOSIS — K5903 Drug induced constipation: Secondary | ICD-10-CM | POA: Diagnosis not present

## 2022-04-01 DIAGNOSIS — G894 Chronic pain syndrome: Secondary | ICD-10-CM | POA: Diagnosis not present

## 2022-04-01 DIAGNOSIS — M19071 Primary osteoarthritis, right ankle and foot: Secondary | ICD-10-CM | POA: Diagnosis not present

## 2022-04-01 DIAGNOSIS — Z79899 Other long term (current) drug therapy: Secondary | ICD-10-CM | POA: Diagnosis not present

## 2022-04-01 DIAGNOSIS — M79671 Pain in right foot: Secondary | ICD-10-CM | POA: Diagnosis not present

## 2022-04-01 DIAGNOSIS — S9031XA Contusion of right foot, initial encounter: Secondary | ICD-10-CM | POA: Diagnosis not present

## 2022-04-01 DIAGNOSIS — M1711 Unilateral primary osteoarthritis, right knee: Secondary | ICD-10-CM | POA: Diagnosis not present

## 2022-04-01 DIAGNOSIS — Z96653 Presence of artificial knee joint, bilateral: Secondary | ICD-10-CM | POA: Diagnosis not present

## 2022-04-01 DIAGNOSIS — R252 Cramp and spasm: Secondary | ICD-10-CM | POA: Diagnosis not present

## 2022-04-01 DIAGNOSIS — G8918 Other acute postprocedural pain: Secondary | ICD-10-CM | POA: Diagnosis not present

## 2022-05-04 ENCOUNTER — Encounter: Payer: Self-pay | Admitting: Emergency Medicine

## 2022-05-04 ENCOUNTER — Other Ambulatory Visit: Payer: Self-pay

## 2022-05-04 ENCOUNTER — Emergency Department
Admission: EM | Admit: 2022-05-04 | Discharge: 2022-05-04 | Disposition: A | Discharge status: Home / Self Care | Payer: Medicare Other | Attending: Emergency Medicine | Admitting: Emergency Medicine

## 2022-05-04 DIAGNOSIS — U071 COVID-19: Secondary | ICD-10-CM

## 2022-05-04 DIAGNOSIS — R059 Cough, unspecified: Secondary | ICD-10-CM | POA: Diagnosis present

## 2022-05-04 DIAGNOSIS — R6889 Other general symptoms and signs: Secondary | ICD-10-CM

## 2022-05-04 LAB — RESP PANEL BY RT-PCR (RSV, FLU A&B, COVID)  RVPGX2
Influenza A by PCR: NEGATIVE
Influenza B by PCR: NEGATIVE
Resp Syncytial Virus by PCR: NEGATIVE
SARS Coronavirus 2 by RT PCR: POSITIVE — AB

## 2022-05-04 MED ORDER — BENZONATATE 100 MG PO CAPS: 100.0000 mg | ORAL_CAPSULE | Freq: Three times a day (TID) | ORAL | 0 refills | Status: AC | PRN

## 2022-05-04 NOTE — ED Provider Notes (Signed)
[facility] Provider Note    Event Date/Time   First MD Initiated Contact with Patient 05/04/22 1051     (approximate)   History   Generalized Body Aches, Nasal Congestion, Cough, and Headache   HPI  Jacqueline Bowman is a 57 y.o. female who presents today for evaluation of 2 days of cough, nasal congestion, body aches, headache.  She reports that it began the day after going to a Valentine's Day party, and multiple people there had similar symptoms.  Patient denies chest pain or shortness of breath.  She has not had a fever.  She denies abdominal pain, nausea, vomiting, diarrhea.  No neck pain or stiffness.     Physical Exam   Triage Vital Signs: ED Triage Vitals  Enc Vitals Group     BP 05/04/22 1045 123/84     Pulse Rate 05/04/22 1045 (!) 105     Resp 05/04/22 1045 16     Temp 05/04/22 1045 99.4 F (37.4 C)     Temp Source 05/04/22 1045 Oral     SpO2 05/04/22 1045 97 %     Weight 05/04/22 1046 175 lb (79.4 kg)     Height 05/04/22 1046 5' 6"$  (1.676 m)     Head Circumference --      Peak Flow --      Pain Score 05/04/22 1046 8     Pain Loc --      Pain Edu? --      Excl. in [place]? --     Most recent vital signs: Vitals:   05/04/22 1045  BP: 123/84  Pulse: (!) 105  Resp: 16  Temp: 99.4 F (37.4 C)  SpO2: 97%    Physical Exam Vitals and nursing note reviewed.  Constitutional:      General: Awake and alert. No acute distress.    Appearance: Normal appearance. The patient is normal weight.  HENT:     Head: Normocephalic and atraumatic.     Mouth: Mucous membranes are moist.  Eyes:     General: PERRL. Normal EOMs        Right eye: No discharge.        Left eye: No discharge.     Conjunctiva/sclera: Conjunctivae normal.  Cardiovascular:     Rate and Rhythm: Normal rate and regular rhythm.     Pulses: Normal pulses.     Heart sounds: Normal heart sounds Pulmonary:     Effort: Pulmonary effort is normal. No respiratory distress.  Able  to speak easily in complete sentences.    Breath sounds: Normal breath sounds.  Abdominal:     Abdomen is soft. There is no abdominal tenderness. No rebound or guarding. No distention. Musculoskeletal:        General: No swelling. Normal range of motion.     Cervical back: Normal range of motion and neck supple.  Skin:    General: Skin is warm and dry.     Capillary Refill: Capillary refill takes less than 2 seconds.     Findings: No rash.  Neurological:     Mental Status: The patient is awake and alert.      ED Results / Procedures / Treatments   Labs (all labs ordered are listed, but only abnormal results are displayed) Labs Reviewed  RESP PANEL BY RT-PCR (RSV, FLU A&B, COVID)  [id number] - Abnormal; Notable for the following components:      Result Value   SARS Coronavirus 2 by RT  PCR POSITIVE (*)    All other components within normal limits     EKG     RADIOLOGY     PROCEDURES:  Critical Care performed:   Procedures   MEDICATIONS ORDERED IN ED: Medications - No data to display   IMPRESSION / MDM / [place] / ED COURSE  I reviewed the triage vital signs and the nursing notes.   Differential diagnosis includes, but is not limited to, influenza, COVID-19, bronchitis, pneumonia, other URI.  Patient is awake and alert, hemodynamically stable and afebrile.  She is nontoxic in appearance.  Most likely etiology is viral especially given that her husband is similar symptoms and she has numerous sick contacts.  Swab obtained in triage.  No shortness of breath, no fever, normal oxygen saturation on room air, normal lung sounds, do not suspect pneumonia.  Patient agreed to being discharged and called with the results.  She was also advised that he can find them on MyChart.  She requested cough medicine which was sent to his pharmacy as he requested.  We discussed return precautions and the importance of close outpatient follow-up.  Patient understands and agrees  with plan.  He was discharged in stable condition.  FOLLOW UP: Call patient with her results as I promised.  She was advised that she has COVID-19.  Discussed symptomatic management and return precautions.  Also discussed importance of isolation unless she requires medical attention.  Patient understands and agrees with plan.   Patient's presentation is most consistent with acute complicated illness / injury requiring diagnostic workup.     FINAL CLINICAL IMPRESSION(S) / ED DIAGNOSES   Final diagnoses:  Flu-like symptoms  COVID-19     Rx / DC Orders   ED Discharge Orders          Ordered    benzonatate (TESSALON PERLES) 100 MG capsule  3 times daily PRN        05/04/22 1059             Note:  This document was prepared using Dragon voice recognition software and may include unintentional dictation errors.   Emeline Gins 05/04/22 1422    Lavonia Drafts, MD 05/04/22 [phone]

## 2022-05-04 NOTE — ED Triage Notes (Signed)
3 days of symptoms, states temp of 99.0

## 2022-05-04 NOTE — Discharge Instructions (Signed)
You may continue to take tylenol/motrin per package instructions to help with your symptoms. You may also take the cough medicine as prescribed. You can find the result of your test on mychart and I will call you if positive. Please return for any new, worsening, or change in symptoms or other concerns.

## 2022-05-08 ENCOUNTER — Emergency Department
Admission: EM | Admit: 2022-05-08 | Discharge: 2022-05-08 | Disposition: A | Discharge status: Home / Self Care | Payer: Medicare Other | Attending: Emergency Medicine | Admitting: Emergency Medicine

## 2022-05-08 ENCOUNTER — Other Ambulatory Visit: Payer: Self-pay

## 2022-05-08 DIAGNOSIS — K047 Periapical abscess without sinus: Secondary | ICD-10-CM | POA: Diagnosis not present

## 2022-05-08 DIAGNOSIS — I10 Essential (primary) hypertension: Secondary | ICD-10-CM | POA: Diagnosis not present

## 2022-05-08 DIAGNOSIS — K029 Dental caries, unspecified: Secondary | ICD-10-CM | POA: Insufficient documentation

## 2022-05-08 DIAGNOSIS — K0889 Other specified disorders of teeth and supporting structures: Secondary | ICD-10-CM | POA: Diagnosis present

## 2022-05-08 DIAGNOSIS — Z853 Personal history of malignant neoplasm of breast: Secondary | ICD-10-CM | POA: Insufficient documentation

## 2022-05-08 MED ORDER — CHLORHEXIDINE GLUCONATE 0.12 % MT SOLN: 15.0000 mL | Freq: Two times a day (BID) | OROMUCOSAL | 0 refills | Status: DC

## 2022-05-08 MED ORDER — OXYCODONE HCL 5 MG PO TABS
5.0000 mg | ORAL_TABLET | Freq: Once | ORAL | Status: AC
  Administered 2022-05-08: 5 mg via ORAL
  Filled 2022-05-08: qty 1

## 2022-05-08 MED ORDER — ACETAMINOPHEN 325 MG PO TABS
650.0000 mg | ORAL_TABLET | Freq: Once | ORAL | Status: AC
  Administered 2022-05-08: 650 mg via ORAL
  Filled 2022-05-08: qty 2

## 2022-05-08 MED ORDER — AMOXICILLIN-POT CLAVULANATE 875-125 MG PO TABS: 1.0000 | ORAL_TABLET | Freq: Two times a day (BID) | ORAL | 0 refills | Status: AC

## 2022-05-08 NOTE — ED Notes (Signed)
Pt states tooth pain for the last 2 days

## 2022-05-08 NOTE — ED Provider Notes (Signed)
[facility] Provider Note    Event Date/Time   First MD Initiated Contact with Patient 05/08/22 [phone]     (approximate)   History   Dental Pain   HPI  Jacqueline Bowman is a 57 y.o. female with a recent diagnosis of COVID-19 who presents today for evaluation of dental pain that began 2 days ago.  Patient reports that it hurts whenever she tries to eat or even when she touches her tooth with her tongue.  No fevers or chills.  No pain with talking or swallowing.  No sublingual swelling.  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 --      Excl. in [place]? --     Most recent vital signs: Vitals:   05/08/22 0951 05/08/22 1014  BP: (!) 166/92 (!) 146/93  Pulse: 78 82  Resp: 16 18  Temp: 98.8 F (37.1 C)   SpO2: 100% 100%    Physical Exam Vitals and nursing note reviewed.  Constitutional:      General: Awake and alert. No acute distress.    Appearance: Normal appearance. The patient is normal weight.  HENT:     Head: Normocephalic and  atraumatic.     Mouth: Mucous membranes are moist.  Tooth #28 with obvious decay at the inferior portion of the tooth with tap tenderness.  No gingival fluctuance.  There is receding gingiva in this area.  There is no sublingual swelling or erythema.  No voice change or trismus.  No neck pain or stiffness.  Poor dentition diffusely with multiple missing teeth Eyes:     General: PERRL. Normal EOMs        Right eye: No discharge.        Left eye: No discharge.     Conjunctiva/sclera: Conjunctivae normal.  Cardiovascular:     Rate and Rhythm: Normal rate and regular rhythm.     Pulses: Normal pulses.  Pulmonary:     Effort: Pulmonary effort is normal. No respiratory distress.     Breath sounds: Normal breath sounds.  Abdominal:     Abdomen is soft. There is no abdominal tenderness.  Musculoskeletal:        General: No swelling. Normal range of motion.  Cervical back: Normal range of motion and neck supple.  Skin:    General: Skin is warm and dry.     Capillary Refill: Capillary refill takes less than 2 seconds.     Findings: No rash.  Neurological:     Mental Status: The patient is awake and alert.      ED Results / Procedures / Treatments   Labs (all labs ordered are listed, but only abnormal results are displayed) Labs Reviewed - No data to display   EKG     RADIOLOGY     PROCEDURES:  Critical Care performed:   Procedures   MEDICATIONS ORDERED IN ED: Medications  oxyCODONE (Oxy IR/ROXICODONE) immediate release tablet 5 mg (5 mg Oral Given 05/08/22 1011)  acetaminophen (TYLENOL) tablet 650 mg (650 mg Oral Given 05/08/22 1011)     IMPRESSION / MDM / ASSESSMENT AND PLAN / ED COURSE  I reviewed the triage vital signs and the nursing notes.   Differential diagnosis includes, but is not limited to, dental caries vs pulpitits versus decay.  Patient presents emergency department awake and alert, hemodynamically stable and afebrile.  There is no no gingival  swelling or fluctuance concerning for gingival abscess.  No trismus, nuchal rigidity, neck pain, hot potato voice, uvular deviation or malocclusion to suggest deep space infection. No sublingual swelling concerning for Ludwig's angina.  Patient was started on antibiotics and chlorhexidine mouth rinse.  She has poor dentition diffusely with an obvious area of dental decay the inferior portion of the tooth that is bothering her.  Patient was treated symptomatically in the emergency department.  She was started on antibiotics and Peridex and instructed to follow-up closely with her dentist this week.  Discussed care plan, return precautions, and advised close outpatient follow-up with dentist. Patient agrees with plan of care.   Patient's presentation is most consistent with acute complicated illness / injury requiring diagnostic workup.     FINAL CLINICAL IMPRESSION(S) / ED DIAGNOSES   Final diagnoses:  Dental caries  Dental infection     Rx / DC Orders   ED Discharge Orders          Ordered    amoxicillin-clavulanate (AUGMENTIN) 875-125 MG tablet  2 times daily        05/08/22 0958    chlorhexidine (PERIDEX) 0.12 % solution  2 times daily        05/08/22 [id number]             Note:  This document was prepared using Dragon voice recognition software and may include unintentional dictation errors.   Marquette Old, PA-C 05/08/22 1056    Nathaniel Man, MD 05/08/22 1644

## 2022-05-08 NOTE — ED Triage Notes (Signed)
Pt reports pain to a tooth in her right lower jaw for 2 days. Pt reports pain is severe and she cannot eat or sleep. Pt reports was diagnosed with covid earlier this week so called her MD to see if it was related and was told to just come to the ED.

## 2022-05-08 NOTE — Discharge Instructions (Signed)
Take the antibiotics as prescribed for your dental infection.  Please follow-up with a dentist soon as possible.  Please return for any new, worsening, or change in symptoms or other concerns.  It was a pleasure caring for you today.

## 2022-06-14 ENCOUNTER — Telehealth: Payer: Self-pay | Admitting: Family Medicine

## 2022-06-14 ENCOUNTER — Ambulatory Visit (INDEPENDENT_AMBULATORY_CARE_PROVIDER_SITE_OTHER): Payer: Medicare Other

## 2022-06-14 VITALS — Ht 65.0 in | Wt 174.0 lb

## 2022-06-14 DIAGNOSIS — Z0000 Encounter for general adult medical examination without abnormal findings: Secondary | ICD-10-CM | POA: Diagnosis not present

## 2022-06-14 NOTE — Patient Instructions (Signed)
[name] , Thank you for taking time to come for your Medicare Wellness Visit. I appreciate your ongoing commitment to your health goals. Please review the following plan we discussed and let me know if I can assist you in the future.   These are the goals we discussed:  Goals      DIET - EAT MORE FRUITS AND VEGETABLES        This is a list of the screening recommended for you and due dates:  Health Maintenance  Topic Date Due   Zoster (Shingles) Vaccine (1 of 2) Never done   DTaP/Tdap/Td vaccine (2 - Td or Tdap) 05/15/2020   COVID-19 Vaccine (5 - 2023-24 season) 11/20/2021   Mammogram  05/06/2023   Medicare Annual Wellness Visit  06/14/2023   Colon Cancer Screening  06/27/2031   Flu Shot  Completed   Hepatitis C Screening: USPSTF Recommendation to screen - Ages 18-79 yo.  Completed   HIV Screening  Completed   HPV Vaccine  Aged Out    Advanced directives: no  Conditions/risks identified: none  Next appointment: Follow up in one year for your annual wellness visit. 06/15/2023@9 :15 am telephone  Preventive Care 40-64 Years, Female Preventive care refers to lifestyle choices and visits with your health care provider that can promote health and wellness. What does preventive care include? A yearly physical exam. This is also called an annual well check. Dental exams once or twice a year. Routine eye exams. Ask your health care provider how often you should have your eyes checked. Personal lifestyle choices, including: Daily care of your teeth and gums. Regular physical activity. Eating a healthy diet. Avoiding tobacco and drug use. Limiting alcohol use. Practicing safe sex. Taking low-dose aspirin daily starting at age 51. Taking vitamin and mineral supplements as recommended by your health care provider. What happens during an annual well check? The services and screenings done by your health care provider during your annual well check will depend on your age, overall  health, lifestyle risk factors, and family history of disease. Counseling  Your health care provider may ask you questions about your: Alcohol use. Tobacco use. Drug use. Emotional well-being. Home and relationship well-being. Sexual activity. Eating habits. Work and work Statistician. Method of birth control. Menstrual cycle. Pregnancy history. Screening  You may have the following tests or measurements: Height, weight, and BMI. Blood pressure. Lipid and cholesterol levels. These may be checked every 5 years, or more frequently if you are over 43 years old. Skin check. Lung cancer screening. You may have this screening every year starting at age 26 if you have a 30-pack-year history of smoking and currently smoke or have quit within the past 15 years. Fecal occult blood test (FOBT) of the stool. You may have this test every year starting at age 2. Flexible sigmoidoscopy or colonoscopy. You may have a sigmoidoscopy every 5 years or a colonoscopy every 10 years starting at age 68. Hepatitis C blood test. Hepatitis B blood test. Sexually transmitted disease (STD) testing. Diabetes screening. This is done by checking your blood sugar (glucose) after you have not eaten for a while (fasting). You may have this done every 1-3 years. Mammogram. This may be done every 1-2 years. Talk to your health care provider about when you should start having regular mammograms. This may depend on whether you have a family history of breast cancer. BRCA-related cancer screening. This may be done if you have a family history of breast, ovarian, tubal, or  peritoneal cancers. Pelvic exam and Pap test. This may be done every 3 years starting at age 18. Starting at age 46, this may be done every 5 years if you have a Pap test in combination with an HPV test. Bone density scan. This is done to screen for osteoporosis. You may have this scan if you are at high risk for osteoporosis. Discuss your test results,  treatment options, and if necessary, the need for more tests with your health care provider. Vaccines  Your health care provider may recommend certain vaccines, such as: Influenza vaccine. This is recommended every year. Tetanus, diphtheria, and acellular pertussis (Tdap, Td) vaccine. You may need a Td booster every 10 years. Zoster vaccine. You may need this after age 70. Pneumococcal 13-valent conjugate (PCV13) vaccine. You may need this if you have certain conditions and were not previously vaccinated. Pneumococcal polysaccharide (PPSV23) vaccine. You may need one or two doses if you smoke cigarettes or if you have certain conditions. Talk to your health care provider about which screenings and vaccines you need and how often you need them. This information is not intended to replace advice given to you by your health care provider. Make sure you discuss any questions you have with your health care provider. Document Released: 04/04/2015 Document Revised: 11/26/2015 Document Reviewed: 01/07/2015 Elsevier Interactive Patient Education  2017 [place] Prevention in the Home Falls can cause injuries. They can happen to people of all ages. There are many things you can do to make your home safe and to help prevent falls. What can I do on the outside of my home? Regularly fix the edges of walkways and driveways and fix any cracks. Remove anything that might make you trip as you walk through a door, such as a raised step or threshold. Trim any bushes or trees on the path to your home. Use bright outdoor lighting. Clear any walking paths of anything that might make someone trip, such as rocks or tools. Regularly check to see if handrails are loose or broken. Make sure that both sides of any steps have handrails. Any raised decks and porches should have guardrails on the edges. Have any leaves, snow, or ice cleared regularly. Use sand or salt on walking paths during winter. Clean  up any spills in your garage right away. This includes oil or grease spills. What can I do in the bathroom? Use night lights. Install grab bars by the toilet and in the tub and shower. Do not use towel bars as grab bars. Use non-skid mats or decals in the tub or shower. If you need to sit down in the shower, use a plastic, non-slip stool. Keep the floor dry. Clean up any water that spills on the floor as soon as it happens. Remove soap buildup in the tub or shower regularly. Attach bath mats securely with double-sided non-slip rug tape. Do not have throw rugs and other things on the floor that can make you trip. What can I do in the bedroom? Use night lights. Make sure that you have a light by your bed that is easy to reach. Do not use any sheets or blankets that are too big for your bed. They should not hang down onto the floor. Have a firm chair that has side arms. You can use this for support while you get dressed. Do not have throw rugs and other things on the floor that can make you trip. What can I do in  the kitchen? Clean up any spills right away. Avoid walking on wet floors. Keep items that you use a lot in easy-to-reach places. If you need to reach something above you, use a strong step stool that has a grab bar. Keep electrical cords out of the way. Do not use floor polish or wax that makes floors slippery. If you must use wax, use non-skid floor wax. Do not have throw rugs and other things on the floor that can make you trip. What can I do with my stairs? Do not leave any items on the stairs. Make sure that there are handrails on both sides of the stairs and use them. Fix handrails that are broken or loose. Make sure that handrails are as long as the stairways. Check any carpeting to make sure that it is firmly attached to the stairs. Fix any carpet that is loose or worn. Avoid having throw rugs at the top or bottom of the stairs. If you do have throw rugs, attach them to the  floor with carpet tape. Make sure that you have a light switch at the top of the stairs and the bottom of the stairs. If you do not have them, ask someone to add them for you. What else can I do to help prevent falls? Wear shoes that: Do not have high heels. Have rubber bottoms. Are comfortable and fit you well. Are closed at the toe. Do not wear sandals. If you use a stepladder: Make sure that it is fully opened. Do not climb a closed stepladder. Make sure that both sides of the stepladder are locked into place. Ask someone to hold it for you, if possible. Clearly mark and make sure that you can see: Any grab bars or handrails. First and last steps. Where the edge of each step is. Use tools that help you move around (mobility aids) if they are needed. These include: Canes. Walkers. Scooters. Crutches. Turn on the lights when you go into a dark area. Replace any light bulbs as soon as they burn out. Set up your furniture so you have a clear path. Avoid moving your furniture around. If any of your floors are uneven, fix them. If there are any pets around you, be aware of where they are. Review your medicines with your doctor. Some medicines can make you feel dizzy. This can increase your chance of falling. Ask your doctor what other things that you can do to help prevent falls. This information is not intended to replace advice given to you by your health care provider. Make sure you discuss any questions you have with your health care provider. Document Released: 01/02/2009 Document Revised: 08/14/2015 Document Reviewed: 04/12/2014 Elsevier Interactive Patient Education  2017 [employer].

## 2022-06-14 NOTE — Telephone Encounter (Signed)
Patient called to inquire about her annual wellness visit today at 10:15 am. Patient was concern she would miss her appointment due to not being able to log into her my chart account. Advise patient that her appointment would be a telephone call from the [facility] and not a virtual visit. Patient verbal understanding.

## 2022-06-14 NOTE — Progress Notes (Signed)
I connected with  Jacqueline Bowman on 06/14/22 by a audio enabled telemedicine application and verified that I am speaking with the correct person using two identifiers.  Patient Location: Home  Provider Location: Office/Clinic  I discussed the limitations of evaluation and management by telemedicine. The patient expressed understanding and agreed to proceed.  Subjective:   Jacqueline Bowman is a 57 y.o. female who presents for Medicare Annual (Subsequent) preventive examination.  Review of Systems    Cardiac Risk Factors include: hypertension    Objective:    Today's Vitals   06/14/22 1021  Weight: 174 lb (78.9 kg)  Height: 5\' 5"  (1.651 m)  PainSc: 6    Body mass index is 28.96 kg/m.     06/14/2022   10:32 AM 05/04/2022   10:47 AM 11/19/2021    1:27 PM 06/22/2021   11:08 PM 06/09/2021   11:03 AM 04/18/2020   10:47 AM 09/10/2018    6:18 PM  Advanced Directives  Does Patient Have a Medical Advance Directive? No No No No No No No  Would patient like information on creating a medical advance directive?  No - Patient declined   No - Patient declined No - Patient declined No - Patient declined    Current Medications (verified) Outpatient Encounter Medications as of 06/14/2022  Medication Sig   benzonatate (TESSALON PERLES) 100 MG capsule Take 1 capsule (100 mg total) by mouth 3 (three) times daily as needed for cough.   cetirizine (ZYRTEC) 10 MG tablet TAKE 1 TABLET BY MOUTH EVERY DAY   chlorhexidine (PERIDEX) 0.12 % solution Use as directed 15 mLs in the mouth or throat 2 (two) times daily.   diclofenac Sodium (VOLTAREN) 1 % GEL Apply 1 application topically daily as needed (pain).   fluticasone (FLONASE) 50 MCG/ACT nasal spray Place into the nose.   ibuprofen (ADVIL) 600 MG tablet Take 1 tablet (600 mg total) by mouth every 6 (six) hours as needed.   Multiple Vitamins-Minerals (MULTIVITAMIN WITH MINERALS) tablet Take 1 tablet by mouth daily. Complete   oxyCODONE-acetaminophen  (PERCOCET) 10-325 MG tablet Take 1 tablet by mouth every 4 (four) hours as needed for pain.   topiramate (TOPAMAX) 50 MG tablet Take 50 mg by mouth daily.   No facility-administered encounter medications on file as of 06/14/2022.    Allergies (verified) Patient has no known allergies.   History: Past Medical History:  Diagnosis Date   Breast cancer ([place])    January 2000 left   Hypertension    Past Surgical History:  Procedure Laterality Date   ABDOMINAL HYSTERECTOMY  2004   BREAST SURGERY     COLONOSCOPY WITH PROPOFOL N/A 06/26/2021   Procedure: COLONOSCOPY WITH PROPOFOL;  Surgeon: Jonathon Bellows, MD;  Location: [facility];  Service: Gastroenterology;  Laterality: N/A;   FOREIGN BODY REMOVAL N/A 04/18/2020   Procedure: FOREIGN BODY REMOVAL ADULT, exploration of umbilicus;  Surgeon: Fredirick Maudlin, MD;  Location: [facility] ORS;  Service: General;  Laterality: N/A;   FRACTURE SURGERY  2018   right foot to correct knee   MASTECTOMY Bilateral 2004   TOTAL KNEE ARTHROPLASTY Bilateral 2018   2018 left 2019 right   Family History  Problem Relation Age of Onset   Breast cancer Mother 69   Hypertension Father    Hyperlipidemia Father    Obesity Father    Colon cancer Brother 58   Heart disease Brother    Social History   Socioeconomic History   Marital status: Legally Separated  Spouse name: Not on file   Number of children: 1   Years of education: Not on file   Highest education level: Not on file  Occupational History   Occupation: disabled  Tobacco Use   Smoking status: Former    Types: Cigarettes   Smokeless tobacco: Never   Tobacco comments:    socailly smoking for a few years in the distant past  Vaping Use   Vaping Use: Never used  Substance and Sexual Activity   Alcohol use: Not Currently    Comment: socially   Drug use: No   Sexual activity: Not Currently  Other Topics Concern   Not on file  Social History Narrative   Not on file   Social Determinants of  Health   Financial Resource Strain: Low Risk  (06/14/2022)   Overall Financial Resource Strain (CARDIA)    Difficulty of Paying Living Expenses: Not hard at all  Food Insecurity: No Food Insecurity (06/14/2022)   Hunger Vital Sign    Worried About Running Out of Food in the Last Year: Never true    [place] in the Last Year: Never true  Transportation Needs: No Transportation Needs (06/14/2022)   PRAPARE - Hydrologist (Medical): No    Lack of Transportation (Non-Medical): No  Physical Activity: Sufficiently Active (06/14/2022)   Exercise Vital Sign    Days of Exercise per Week: 4 days    Minutes of Exercise per Session: 40 min  Stress: No Stress Concern Present (06/14/2022)   [place]    Feeling of Stress : Only a little  Social Connections: Moderately Integrated (06/14/2022)   Social Connection and Isolation Panel [NHANES]    Frequency of Communication with Friends and Family: More than three times a week    Frequency of Social Gatherings with Friends and Family: More than three times a week    Attends Religious Services: More than 4 times per year    Active Member of [employer] or Organizations: Yes    Attends Archivist Meetings: 1 to 4 times per year    Marital Status: Divorced    Tobacco Counseling Counseling given: Not Answered Tobacco comments: socailly smoking for a few years in the distant past   Clinical Intake:  Pre-visit preparation completed: Yes  Pain : 0-10 Pain Score: 6  Pain Type: Chronic pain Pain Location: Generalized Pain Descriptors / Indicators: Aching Pain Onset: More than a month ago Pain Frequency: Intermittent Pain Relieving Factors: medications (pain clinic)  Pain Relieving Factors: medications (pain clinic)      Diabetic?NO      Activities of Daily Living    06/14/2022   10:33 AM 03/05/2022   10:43 AM  In your present state of  health, do you have any difficulty performing the following activities:  Hearing? 0 0  Vision? 0 0  Difficulty concentrating or making decisions? 0 0  Walking or climbing stairs? 1 1  Dressing or bathing? 0 0  Doing errands, shopping? 0 0  Preparing Food and eating ? N   Using the Toilet? N   In the past six months, have you accidently leaked urine? N   Do you have problems with loss of bowel control? N   Managing your Medications? N   Managing your Finances? N   Housekeeping or managing your Housekeeping? N     Patient Care Team: Virginia Crews, MD as PCP - General (  Family Medicine) Rico Junker, RN as Registered Nurse Theodore Demark, RN (Inactive) as Registered Nurse  Indicate any recent Medical Services you may have received from other than [facility] providers in the past year (date may be approximate).     Assessment:   This is a routine wellness examination for [place].  Hearing/Vision screen Hearing Screening - Comments:: Adequate hearing Vision Screening - Comments:: Adequate vision w/glasses Dr Matilde Sprang  Dietary issues and exercise activities discussed: Current Exercise Habits: Home exercise routine, Type of exercise: walking, Time (Minutes): 40, Frequency (Times/Week): 3, Weekly Exercise (Minutes/Week): 120, Intensity: Mild, Exercise limited by: orthopedic condition(s)   Goals Addressed             This Visit's Progress    DIET - EAT MORE FRUITS AND VEGETABLES   On track      Depression Screen    06/14/2022   10:27 AM 03/05/2022   10:43 AM 01/04/2022    3:10 PM 06/09/2021   11:01 AM 04/23/2021   11:43 AM 07/24/2020    2:01 PM 04/14/2020   10:09 AM  PHQ 2/9 Scores  PHQ - 2 Score 0 1 2 0 2 1 0  PHQ- 9 Score  2 3 0 2 4     Fall Risk    06/14/2022   10:23 AM 03/05/2022   10:43 AM 01/04/2022    3:10 PM 06/09/2021   11:03 AM 04/23/2021   11:42 AM  Fall Risk   Falls in the past year? 0 0 0 0 1  Number falls in past yr: 0 0 0 0 0  Injury with Fall? 0 0 0 0  1  Risk for fall due to : No Fall Risks No Fall Risks No Fall Risks No Fall Risks   Follow up Education provided;Falls prevention discussed Falls evaluation completed Falls evaluation completed Falls evaluation completed     FALL RISK PREVENTION PERTAINING TO THE HOME:  Any stairs in or around the home? Yes  If so, are there any without handrails? Yes  Home free of loose throw rugs in walkways, pet beds, electrical cords, etc? Yes  Adequate lighting in your home to reduce risk of falls? Yes   ASSISTIVE DEVICES UTILIZED TO PREVENT FALLS:  Life alert? No  Use of a cane, walker or w/c? Yes cane Grab bars in the bathroom? Yes  Shower chair or bench in shower? yes Elevated toilet seat or a handicapped toilet? No   Cognitive Function:        06/14/2022   10:39 AM  6CIT Screen  What Year? 0 points  What month? 0 points  What time? 0 points  Count back from 20 0 points  Months in reverse 4 points  Repeat phrase 0 points  Total Score 4 points   Immunizations Immunization History  Administered Date(s) Administered   Influenza-Unspecified 03/09/2021, 12/30/2021   Moderna Covid-19 Vaccine Bivalent Booster 72yrs & up 03/09/2021   Moderna Sars-Covid-2 Vaccination 05/31/2019, 07/04/2019, 12/14/2019   Tdap 05/15/2010   Varicella 12/30/2021    TDAP status: Up to date  Flu Vaccine status: Up to date  Pneumococcal vaccine status: Up to date  Covid-19 vaccine status: Completed vaccines  Qualifies for Shingles Vaccine? Yes   Zostavax completed Yes   Shingrix Completed?: Yes  Screening Tests Health Maintenance  Topic Date Due   Zoster Vaccines- Shingrix (1 of 2) Never done   DTaP/Tdap/Td (2 - Td or Tdap) 05/15/2020   COVID-19 Vaccine (5 - 2023-24 season) 11/20/2021  MAMMOGRAM  05/06/2023   Medicare Annual Wellness (AWV)  06/14/2023   COLONOSCOPY (Pts 45-109yrs Insurance coverage will need to be confirmed)  06/27/2031   INFLUENZA VACCINE  Completed   Hepatitis C Screening   Completed   HIV Screening  Completed   HPV VACCINES  Aged Out    Health Maintenance  Health Maintenance Due  Topic Date Due   Zoster Vaccines- Shingrix (1 of 2) Never done   DTaP/Tdap/Td (2 - Td or Tdap) 05/15/2020   COVID-19 Vaccine (5 - 2023-24 season) 11/20/2021    Colorectal cancer screening: Type of screening: Colonoscopy. Completed yes. Repeat every 10 years  Mammogram status: Completed yes. Repeat every year  Bone Density status: Completed yes. Results reflect: Bone density results: NORMAL. Repeat every 5 years.  Lung Cancer Screening: (Low Dose CT Chest recommended if Age 66-80 years, 30 pack-year currently smoking OR have quit w/in 15years.) does not qualify.   Lung Cancer Screening Referral: no  Additional Screening:  Hepatitis C Screening: does not qualify; Completed yes  Vision Screening: Recommended annual ophthalmology exams for early detection of glaucoma and other disorders of the eye. Is the patient up to date with their annual eye exam?  Yes  Who is the provider or what is the name of the office in which the patient attends annual eye exams? Dr Matilde Sprang If pt is not established with a provider, would they like to be referred to a provider to establish care? No .   Dental Screening: Recommended annual dental exams for proper oral hygiene  Community Resource Referral / Chronic Care Management: CRR required this visit?  No   CCM required this visit?  No      Plan:     I have personally reviewed and noted the following in the patient's chart:   Medical and social history Use of alcohol, tobacco or illicit drugs  Current medications and supplements including opioid prescriptions. Patient is currently taking opioid prescriptions. Information provided to patient regarding non-opioid alternatives. Patient advised to discuss non-opioid treatment plan with their provider. Functional ability and status Nutritional status Physical activity Advanced  directives List of other physicians Hospitalizations, surgeries, and ER visits in previous 12 months Vitals Screenings to include cognitive, depression, and falls Referrals and appointments  In addition, I have reviewed and discussed with patient certain preventive protocols, quality metrics, and best practice recommendations. A written personalized care plan for preventive services as well as general preventive health recommendations were provided to patient.     Roger Shelter, LPN   [id number]   Nurse Notes: pt is doing good w/no concerns or questions at this time.

## 2022-06-24 DIAGNOSIS — R252 Cramp and spasm: Secondary | ICD-10-CM | POA: Diagnosis not present

## 2022-06-24 DIAGNOSIS — M7061 Trochanteric bursitis, right hip: Secondary | ICD-10-CM | POA: Diagnosis not present

## 2022-06-24 DIAGNOSIS — Z79899 Other long term (current) drug therapy: Secondary | ICD-10-CM | POA: Diagnosis not present

## 2022-06-24 DIAGNOSIS — G894 Chronic pain syndrome: Secondary | ICD-10-CM | POA: Diagnosis not present

## 2022-06-24 DIAGNOSIS — K5903 Drug induced constipation: Secondary | ICD-10-CM | POA: Diagnosis not present

## 2022-06-24 DIAGNOSIS — S9031XA Contusion of right foot, initial encounter: Secondary | ICD-10-CM | POA: Diagnosis not present

## 2022-06-24 DIAGNOSIS — M1711 Unilateral primary osteoarthritis, right knee: Secondary | ICD-10-CM | POA: Diagnosis not present

## 2022-08-24 DIAGNOSIS — M19079 Primary osteoarthritis, unspecified ankle and foot: Secondary | ICD-10-CM | POA: Diagnosis not present

## 2022-08-24 DIAGNOSIS — Z96659 Presence of unspecified artificial knee joint: Secondary | ICD-10-CM | POA: Diagnosis not present

## 2022-08-24 DIAGNOSIS — M7061 Trochanteric bursitis, right hip: Secondary | ICD-10-CM | POA: Diagnosis not present

## 2022-08-24 DIAGNOSIS — G894 Chronic pain syndrome: Secondary | ICD-10-CM | POA: Diagnosis not present

## 2022-08-24 DIAGNOSIS — K5903 Drug induced constipation: Secondary | ICD-10-CM | POA: Diagnosis not present

## 2022-08-24 DIAGNOSIS — S9031XA Contusion of right foot, initial encounter: Secondary | ICD-10-CM | POA: Diagnosis not present

## 2022-08-24 DIAGNOSIS — Z79899 Other long term (current) drug therapy: Secondary | ICD-10-CM | POA: Diagnosis not present

## 2022-08-24 DIAGNOSIS — R252 Cramp and spasm: Secondary | ICD-10-CM | POA: Diagnosis not present

## 2022-08-24 DIAGNOSIS — M1711 Unilateral primary osteoarthritis, right knee: Secondary | ICD-10-CM | POA: Diagnosis not present

## 2022-08-31 DIAGNOSIS — M12571 Traumatic arthropathy, right ankle and foot: Secondary | ICD-10-CM | POA: Diagnosis not present

## 2022-09-10 DIAGNOSIS — M79671 Pain in right foot: Secondary | ICD-10-CM | POA: Diagnosis not present

## 2022-09-10 DIAGNOSIS — M12571 Traumatic arthropathy, right ankle and foot: Secondary | ICD-10-CM | POA: Diagnosis not present

## 2022-10-14 DIAGNOSIS — G894 Chronic pain syndrome: Secondary | ICD-10-CM | POA: Diagnosis not present

## 2022-10-14 DIAGNOSIS — M1711 Unilateral primary osteoarthritis, right knee: Secondary | ICD-10-CM | POA: Diagnosis not present

## 2022-10-14 DIAGNOSIS — Z79899 Other long term (current) drug therapy: Secondary | ICD-10-CM | POA: Diagnosis not present

## 2022-10-14 DIAGNOSIS — M79671 Pain in right foot: Secondary | ICD-10-CM | POA: Diagnosis not present

## 2022-10-14 DIAGNOSIS — M545 Low back pain, unspecified: Secondary | ICD-10-CM | POA: Diagnosis not present

## 2022-10-14 DIAGNOSIS — R252 Cramp and spasm: Secondary | ICD-10-CM | POA: Diagnosis not present

## 2022-11-16 ENCOUNTER — Other Ambulatory Visit: Payer: Self-pay

## 2022-11-16 ENCOUNTER — Encounter: Payer: Self-pay | Admitting: *Deleted

## 2022-11-16 ENCOUNTER — Emergency Department: Payer: Medicare Other

## 2022-11-16 ENCOUNTER — Emergency Department
Admission: EM | Admit: 2022-11-16 | Discharge: 2022-11-16 | Disposition: A | Discharge status: Home / Self Care | Payer: Medicare Other | Attending: Emergency Medicine | Admitting: Emergency Medicine

## 2022-11-16 DIAGNOSIS — S060X0A Concussion without loss of consciousness, initial encounter: Secondary | ICD-10-CM | POA: Diagnosis not present

## 2022-11-16 DIAGNOSIS — S060XAA Concussion with loss of consciousness status unknown, initial encounter: Secondary | ICD-10-CM | POA: Insufficient documentation

## 2022-11-16 DIAGNOSIS — W2203XA Walked into furniture, initial encounter: Secondary | ICD-10-CM | POA: Diagnosis not present

## 2022-11-16 DIAGNOSIS — R519 Headache, unspecified: Secondary | ICD-10-CM | POA: Diagnosis present

## 2022-11-16 DIAGNOSIS — S0990XA Unspecified injury of head, initial encounter: Secondary | ICD-10-CM | POA: Diagnosis not present

## 2022-11-16 MED ORDER — ONDANSETRON 4 MG PO TBDP
4.0000 mg | ORAL_TABLET | Freq: Once | ORAL | Status: AC
  Administered 2022-11-16: 4 mg via ORAL
  Filled 2022-11-16: qty 1

## 2022-11-16 MED ORDER — ONDANSETRON 4 MG PO TBDP: 4.0000 mg | ORAL_TABLET | Freq: Three times a day (TID) | ORAL | 0 refills | Status: AC | PRN

## 2022-11-16 NOTE — Discharge Instructions (Addendum)
CT scan of your head did not show any signs of internal bleeding.  Most likely you have a concussion.  Follow-up closely with your primary care physician.  Pain control:  Ibuprofen (motrin/aleve/advil) - You can take 3 tablets (600 mg) every 6 hours as needed for pain/fever.  Acetaminophen (tylenol) - You can take 2 extra strength tablets (1000 mg) every 6 hours as needed for pain/fever.  You can alternate these medications or take them together.  Make sure you eat food/drink water when taking these medications.  zofran (ondansetron) - nausea medication, take 1 tablet every 8 hours as needed for nausea/vomiting.

## 2022-11-16 NOTE — ED Triage Notes (Signed)
Pt hit her head on a cabinet 2 weeks ago.  Pt continues to have headaches and nausea.  Pt alert, speech clear.  Pt ambulatory without diff.

## 2022-11-16 NOTE — ED Provider Notes (Signed)
[facility] Provider Note    Event Date/Time   First MD Initiated Contact with Patient 11/16/22 1924     (approximate)   History   Headache and Nausea   HPI  Jacqueline Bowman is a 57 y.o. female presents to the emergency department with headache.  Hit her head on a open cabinet door 2 weeks ago.  Since that time diffuse headache associated with mental fogginess, nausea and increased fatigue.  Denies any numbness or weakness of extremities.  Not on anticoagulation.  No other falls or trauma.  States that she has multiple family members who are nurses who told her she had to come in to get a CT scan of her head.     Physical Exam   Triage Vital Signs: ED Triage Vitals  Encounter Vitals Group     BP 11/16/22 1842 (!) 153/82     Systolic BP Percentile --      Diastolic BP Percentile --      Pulse Rate 11/16/22 1842 85     Resp 11/16/22 1842 20     Temp 11/16/22 1842 98.4 F (36.9 C)     Temp Source 11/16/22 1842 Oral     SpO2 11/16/22 1842 99 %     Weight 11/16/22 1841 170 lb (77.1 kg)     Height 11/16/22 1841 5\' 5"  (1.651 m)     Head Circumference --      Peak Flow --      Pain Score 11/16/22 1841 6     Pain Loc --      Pain Education --      Exclude from Growth Chart --     Most recent vital signs: Vitals:   11/16/22 1842  BP: (!) 153/82  Pulse: 85  Resp: 20  Temp: 98.4 F (36.9 C)  SpO2: 99%    Physical Exam Constitutional:      Appearance: She is well-developed.  HENT:     Head: Atraumatic.  Eyes:     Conjunctiva/sclera: Conjunctivae normal.  Cardiovascular:     Rate and Rhythm: Regular rhythm.  Pulmonary:     Effort: No respiratory distress.  Abdominal:     General: There is no distension.  Musculoskeletal:        General: Normal range of motion.     Cervical back: Normal range of motion.  Skin:    General: Skin is warm.  Neurological:     Mental Status: She is alert. Mental status is at baseline.     GCS: GCS eye  subscore is 4. GCS verbal subscore is 5. GCS motor subscore is 6.     Cranial Nerves: Cranial nerves 2-12 are intact.     Sensory: Sensation is intact.     Motor: Motor function is intact.      IMPRESSION / MDM / ASSESSMENT AND PLAN / ED COURSE  I reviewed the triage vital signs and the nursing notes.  Differential diagnosis including concussion, intracranial hemorrhage, musculoskeletal strain   RADIOLOGY I independently reviewed imaging, my interpretation of imaging: CT scan of the head without signs of intracranial hemorrhage.  Read as no acute findings.   Labs (all labs ordered are listed, but only abnormal results are displayed) Labs interpreted as -    Labs Reviewed - No data to display    Nonfocal neurologic exam, patient most likely with postconcussive syndrome given her symptom onset immediately following head trauma.  Discussed symptomatic treatment for concussion.  Discussed  mental rest and follow-up with primary care provider.  Given antiemetics.  Clinical picture is not consistent with a meningitis.  Low suspicion for cerebral venous thrombosis.  Given return precautions for any ongoing or worsening symptoms.   PROCEDURES:  Critical Care performed: No  Procedures  Patient's presentation is most consistent with acute illness / injury with system symptoms.   MEDICATIONS ORDERED IN ED: Medications  ondansetron (ZOFRAN-ODT) disintegrating tablet 4 mg (4 mg Oral Given 11/16/22 1951)    FINAL CLINICAL IMPRESSION(S) / ED DIAGNOSES   Final diagnoses:  Concussion with unknown loss of consciousness status, initial encounter     Rx / DC Orders   ED Discharge Orders          Ordered    ondansetron (ZOFRAN-ODT) 4 MG disintegrating tablet  Every 8 hours PRN        11/16/22 2023             Note:  This document was prepared using Dragon voice recognition software and may include unintentional dictation errors.   Corena Herter, MD 11/16/22 2026

## 2022-11-30 ENCOUNTER — Telehealth: Payer: Self-pay

## 2022-11-30 NOTE — Telephone Encounter (Signed)
Transition Care Management Unsuccessful Follow-up Telephone Call  Date of discharge and from where:  11/16/2022 [facility]  Attempts:  1st Attempt  Reason for unsuccessful TCM follow-up call:  Voice mail full  Jacqueline Bowman [facility]  [facility], [facility] Guide Direct Dial: [phone]  Website: [email or web address]

## 2022-12-01 ENCOUNTER — Telehealth: Payer: Self-pay

## 2022-12-01 NOTE — Telephone Encounter (Signed)
Transition Care Management Follow-up Telephone Call Date of discharge and from where: 11/16/2022 [facility] How have you been since you were released from the hospital? Patient stated she is feeling much better. Any questions or concerns? No  Items Reviewed: Did the pt receive and understand the discharge instructions provided? Yes  Medications obtained and verified? Yes  Other? No  Any new allergies since your discharge? No  Dietary orders reviewed? Yes Do you have support at home? Yes   Follow up appointments reviewed:  PCP Hospital f/u appt confirmed? Yes  Scheduled to see Marzella Schlein. Beryle Flock, MD on 01/06/2023 @ [facility]. Specialist Hospital f/u appt confirmed? No  Scheduled to see  on  @ . Are transportation arrangements needed? No  If their condition worsens, is the pt aware to call PCP or go to the Emergency Dept.? Yes Was the patient provided with contact information for the PCP's office or ED? Yes Was to pt encouraged to call back with questions or concerns? Yes  Maciej Schweitzer [facility]  [facility], [facility] Guide Direct Dial: [phone]  Website: [email or web address]

## 2022-12-02 DIAGNOSIS — R202 Paresthesia of skin: Secondary | ICD-10-CM | POA: Diagnosis not present

## 2022-12-16 DIAGNOSIS — K5903 Drug induced constipation: Secondary | ICD-10-CM | POA: Diagnosis not present

## 2022-12-16 DIAGNOSIS — S9031XA Contusion of right foot, initial encounter: Secondary | ICD-10-CM | POA: Diagnosis not present

## 2022-12-16 DIAGNOSIS — Z79899 Other long term (current) drug therapy: Secondary | ICD-10-CM | POA: Diagnosis not present

## 2022-12-16 DIAGNOSIS — M545 Low back pain, unspecified: Secondary | ICD-10-CM | POA: Diagnosis not present

## 2022-12-16 DIAGNOSIS — M1711 Unilateral primary osteoarthritis, right knee: Secondary | ICD-10-CM | POA: Diagnosis not present

## 2022-12-16 DIAGNOSIS — G894 Chronic pain syndrome: Secondary | ICD-10-CM | POA: Diagnosis not present

## 2022-12-16 DIAGNOSIS — M7061 Trochanteric bursitis, right hip: Secondary | ICD-10-CM | POA: Diagnosis not present

## 2022-12-16 DIAGNOSIS — R252 Cramp and spasm: Secondary | ICD-10-CM | POA: Diagnosis not present

## 2022-12-16 DIAGNOSIS — G8918 Other acute postprocedural pain: Secondary | ICD-10-CM | POA: Diagnosis not present

## 2023-01-06 ENCOUNTER — Encounter: Payer: Self-pay | Admitting: Family Medicine

## 2023-01-06 ENCOUNTER — Ambulatory Visit: Payer: Medicare Other | Admitting: Family Medicine

## 2023-01-06 VITALS — BP 111/82 | HR 86 | Temp 97.9°F

## 2023-01-06 DIAGNOSIS — Z0000 Encounter for general adult medical examination without abnormal findings: Secondary | ICD-10-CM

## 2023-01-06 DIAGNOSIS — Z0001 Encounter for general adult medical examination with abnormal findings: Secondary | ICD-10-CM

## 2023-01-06 DIAGNOSIS — I10 Essential (primary) hypertension: Secondary | ICD-10-CM | POA: Diagnosis not present

## 2023-01-06 DIAGNOSIS — Z853 Personal history of malignant neoplasm of breast: Secondary | ICD-10-CM

## 2023-01-06 DIAGNOSIS — Z23 Encounter for immunization: Secondary | ICD-10-CM

## 2023-01-06 DIAGNOSIS — M7061 Trochanteric bursitis, right hip: Secondary | ICD-10-CM | POA: Diagnosis not present

## 2023-01-06 DIAGNOSIS — R7303 Prediabetes: Secondary | ICD-10-CM | POA: Diagnosis not present

## 2023-01-06 DIAGNOSIS — S93491A Sprain of other ligament of right ankle, initial encounter: Secondary | ICD-10-CM | POA: Diagnosis not present

## 2023-01-06 MED ORDER — MELOXICAM 15 MG PO TABS: 15.0000 mg | ORAL_TABLET | Freq: Every day | ORAL | 0 refills | Status: DC

## 2023-01-06 NOTE — Assessment & Plan Note (Signed)
Recommend low carb diet °Recheck A1c  °

## 2023-01-06 NOTE — Progress Notes (Signed)
Complete physical exam  Patient: Jacqueline Bowman   DOB: 1965-10-20   57 y.o. Female  MRN: [id number]  Subjective:    Chief Complaint  Patient presents with  . Annual Exam    Annual Physical     Jacqueline Bowman is a 57 y.o. female who presents today for a complete physical exam. She reports consuming a general diet.  She generally feels poorly. She reports sleeping fairly well. She does have additional problems to discuss today.   Discussed the use of AI scribe software for clinical note transcription with the patient, who gave verbal consent to proceed.  History of Present Illness   The patient, with a significant past medical history of double mastectomy and knee replacements, presents with multiple complaints. They report a recent concussion, which resulted in nausea, weakness, and a significant weight loss of about 15-20 pounds due to inability to eat. The patient also reports a fall about two months ago, which resulted in a bruised leg and foot. The patient describes the leg as still healing with an aching sensation, particularly at night. The foot, which was previously broken by a doctor, is reported to swell significantly and change color. The patient denies twisting the ankle during the fall but reports chronic pain in the foot.  In addition to these recent injuries, the patient reports chronic pain, which they manage with the help of a pain management doctor. They express frustration with the constant pain and the perception that others may view them as addicted to their pain medication. The patient reports being in pain daily and often uses a heating pad for relief. They also mention walking bent over due to the pain and discomfort.        Most recent fall risk assessment:    01/06/2023    2:32 PM  Fall Risk   Falls in the past year? 1  Number falls in past yr: 0  Injury with Fall? 1  Risk for fall due to : No Fall Risks  Follow up Falls evaluation completed     Most  recent depression screenings:    01/06/2023    2:32 PM 06/14/2022   10:27 AM  PHQ 2/9 Scores  PHQ - 2 Score 0 0  PHQ- 9 Score 0         Patient Care Team: Erasmo Downer, MD as PCP - General (Family Medicine) Jim Like, RN as Registered Nurse Scarlett Presto, RN (Inactive) as Registered Nurse   Outpatient Medications Prior to Visit  Medication Sig  . benzonatate (TESSALON PERLES) 100 MG capsule Take 1 capsule (100 mg total) by mouth 3 (three) times daily as needed for cough.  . cetirizine (ZYRTEC) 10 MG tablet TAKE 1 TABLET BY MOUTH EVERY DAY  . chlorhexidine (PERIDEX) 0.12 % solution Use as directed 15 mLs in the mouth or throat 2 (two) times daily.  . diclofenac Sodium (VOLTAREN) 1 % GEL Apply 1 application topically daily as needed (pain).  . fluticasone (FLONASE) 50 MCG/ACT nasal spray Place into the nose.  . Multiple Vitamins-Minerals (MULTIVITAMIN WITH MINERALS) tablet Take 1 tablet by mouth daily. Complete  . ondansetron (ZOFRAN-ODT) 4 MG disintegrating tablet Take 1 tablet (4 mg total) by mouth every 8 (eight) hours as needed for nausea or vomiting.  Marland Kitchen oxyCODONE-acetaminophen (PERCOCET) 10-325 MG tablet Take 1 tablet by mouth every 4 (four) hours as needed for pain.  Marland Kitchen topiramate (TOPAMAX) 50 MG tablet Take 50 mg by mouth daily.  . [  DISCONTINUED] ibuprofen (ADVIL) 600 MG tablet Take 1 tablet (600 mg total) by mouth every 6 (six) hours as needed.   No facility-administered medications prior to visit.    ROS     Objective:     BP 111/82   Pulse 86   Temp 97.9 F (36.6 C) (Oral)   SpO2 100%    Physical Exam Vitals reviewed.  Constitutional:      General: She is not in acute distress.    Appearance: Normal appearance. She is well-developed. She is not diaphoretic.  HENT:     Head: Normocephalic and atraumatic.     Right Ear: Tympanic membrane, ear canal and external ear normal.     Left Ear: Tympanic membrane, ear canal and external ear normal.      Nose: Nose normal.     Mouth/Throat:     Mouth: Mucous membranes are moist.     Pharynx: Oropharynx is clear. No oropharyngeal exudate.  Eyes:     General: No scleral icterus.    Conjunctiva/sclera: Conjunctivae normal.     Pupils: Pupils are equal, round, and reactive to light.  Neck:     Thyroid: No thyromegaly.  Cardiovascular:     Rate and Rhythm: Normal rate and regular rhythm.     Heart sounds: Normal heart sounds. No murmur heard. Pulmonary:     Effort: Pulmonary effort is normal. No respiratory distress.     Breath sounds: Normal breath sounds. No wheezing or rales.  Abdominal:     General: There is no distension.     Palpations: Abdomen is soft.     Tenderness: There is no abdominal tenderness.  Musculoskeletal:        General: No deformity.     Cervical back: Neck supple.     Right lower leg: No edema.     Left lower leg: No edema.  Lymphadenopathy:     Cervical: No cervical adenopathy.  Skin:    General: Skin is warm and dry.     Findings: No rash.  Neurological:     Mental Status: She is alert and oriented to person, place, and time. Mental status is at baseline.     Gait: Gait normal.  Psychiatric:        Mood and Affect: Mood normal.        Behavior: Behavior normal.        Thought Content: Thought content normal.    Physical Exam   VITALS: Blood pressure normal. HEENT: Ears normal. Throat normal. MUSCULOSKELETAL: Hip tenderness upon palpation of R greater trochanter. SKIN: No bruising present.       No results found for any visits on 01/06/23.     Assessment & Plan:    Routine Health Maintenance and Physical Exam  Immunization History  Administered Date(s) Administered  . Influenza, Seasonal, Injecte, Preservative Fre 01/06/2023  . Influenza-Unspecified 03/09/2021, 12/30/2021  . Moderna Covid-19 Vaccine Bivalent Booster 37yrs & up 03/09/2021  . Moderna Sars-Covid-2 Vaccination 05/31/2019, 07/04/2019, 12/14/2019  . Tdap 05/15/2010  .  Varicella 12/30/2021    Health Maintenance  Topic Date Due  . Zoster Vaccines- Shingrix (1 of 2) Never done  . DTaP/Tdap/Td (2 - Td or Tdap) 05/15/2020  . COVID-19 Vaccine (5 - 2023-24 season) 11/21/2022  . Medicare Annual Wellness (AWV)  06/14/2023  . Colonoscopy  06/27/2031  . INFLUENZA VACCINE  Completed  . Hepatitis C Screening  Completed  . HIV Screening  Completed  . HPV VACCINES  Aged Out  Discussed health benefits of physical activity, and encouraged her to engage in regular exercise appropriate for her age and condition.  Problem List Items Addressed This Visit       Cardiovascular and Mediastinum   Essential hypertension    Well-controlled Diet controlled Recheck metabolic panel      Relevant Orders   Comprehensive metabolic panel   Lipid panel     Other   History of breast cancer   Prediabetes    Recommend low carb diet Recheck A1c       Relevant Orders   Hemoglobin A1c   Other Visit Diagnoses     Encounter for annual physical exam    -  Primary   Relevant Orders   Hemoglobin A1c   Comprehensive metabolic panel   Lipid panel   Immunization due       Relevant Orders   Flu vaccine trivalent PF, 6mos and older(Flulaval,Afluria,Fluarix,Fluzone) (Completed)   Trochanteric bursitis of right hip       Needs flu shot       Relevant Orders   Flu vaccine trivalent PF, 6mos and older(Flulaval,Afluria,Fluarix,Fluzone) (Completed)   Sprain of anterior talofibular ligament of right ankle, initial encounter              R Hip Bursitis Likely due to recent fall, causing persistent pain and discomfort, especially at night. -Start Meloxicam for anti-inflammatory effect. -Provide exercises for hip bursitis. -Consider steroid injection if no improvement with medication and exercises.  Ankle Sprain Likely due to recent fall, causing intermittent swelling and pain. -Advise supportive shoes and rest. -Recommend ankle strengthening exercises (writing ABCs  with foot, heel raises). -Start Meloxicam for anti-inflammatory effect.  Concussion Resolved with no ongoing symptoms. -No further action required.  Chronic Pain Ongoing issue, managed by pain management. -Encourage communication with pain management doctor about persistent pain.  General Health Maintenance -Administer influenza vaccine today. -Track down records of tetanus and shingles vaccines. -Order labs for blood sugar (A1c), cholesterol, kidney and liver function. -Schedule next physical exam.        Return in about 1 year (around 01/06/2024) for CPE.     Shirlee Latch, MD

## 2023-01-06 NOTE — Assessment & Plan Note (Signed)
Well-controlled Diet controlled Recheck metabolic panel

## 2023-01-07 LAB — COMPREHENSIVE METABOLIC PANEL
ALT: 14 [IU]/L (ref 0–32)
AST: 18 [IU]/L (ref 0–40)
Albumin: 4.6 g/dL (ref 3.8–4.9)
Alkaline Phosphatase: 87 [IU]/L (ref 44–121)
BUN/Creatinine Ratio: 15 (ref 9–23)
BUN: 13 mg/dL (ref 6–24)
Bilirubin Total: <0.2 mg/dL (ref 0.0–1.2)
CO2: 25 mmol/L (ref 20–29)
Calcium: 10.1 mg/dL (ref 8.7–10.2)
Chloride: 105 mmol/L (ref 96–106)
Creatinine, Ser: 0.89 mg/dL (ref 0.57–1.00)
Globulin, Total: 2.6 g/dL (ref 1.5–4.5)
Glucose: 75 mg/dL (ref 70–99)
Potassium: 3.8 mmol/L (ref 3.5–5.2)
Sodium: 143 mmol/L (ref 134–144)
Total Protein: 7.2 g/dL (ref 6.0–8.5)
eGFR: 75 mL/min/{1.73_m2} (ref >59)

## 2023-01-07 LAB — LIPID PANEL
Chol/HDL Ratio: 2.3 {ratio} (ref 0.0–4.4)
Cholesterol, Total: 244 mg/dL — ABNORMAL HIGH (ref 100–199)
HDL: 105 mg/dL (ref >39)
LDL Chol Calc (NIH): 132 mg/dL — ABNORMAL HIGH (ref 0–99)
Triglycerides: 43 mg/dL (ref 0–149)
VLDL Cholesterol Cal: 7 mg/dL (ref 5–40)

## 2023-01-07 LAB — HEMOGLOBIN A1C
Est. average glucose Bld gHb Est-mCnc: 108 mg/dL
Hgb A1c MFr Bld: 5.4 % (ref 4.8–5.6)

## 2023-02-15 DIAGNOSIS — M545 Low back pain, unspecified: Secondary | ICD-10-CM | POA: Diagnosis not present

## 2023-02-15 DIAGNOSIS — Z96659 Presence of unspecified artificial knee joint: Secondary | ICD-10-CM | POA: Diagnosis not present

## 2023-02-15 DIAGNOSIS — R252 Cramp and spasm: Secondary | ICD-10-CM | POA: Diagnosis not present

## 2023-02-15 DIAGNOSIS — M7061 Trochanteric bursitis, right hip: Secondary | ICD-10-CM | POA: Diagnosis not present

## 2023-02-15 DIAGNOSIS — G8918 Other acute postprocedural pain: Secondary | ICD-10-CM | POA: Diagnosis not present

## 2023-02-15 DIAGNOSIS — K5903 Drug induced constipation: Secondary | ICD-10-CM | POA: Diagnosis not present

## 2023-02-15 DIAGNOSIS — G894 Chronic pain syndrome: Secondary | ICD-10-CM | POA: Diagnosis not present

## 2023-02-15 DIAGNOSIS — S9031XA Contusion of right foot, initial encounter: Secondary | ICD-10-CM | POA: Diagnosis not present

## 2023-02-15 DIAGNOSIS — Z79899 Other long term (current) drug therapy: Secondary | ICD-10-CM | POA: Diagnosis not present

## 2023-02-15 DIAGNOSIS — M1711 Unilateral primary osteoarthritis, right knee: Secondary | ICD-10-CM | POA: Diagnosis not present

## 2023-02-25 ENCOUNTER — Other Ambulatory Visit: Payer: Self-pay | Admitting: Family Medicine

## 2023-02-28 NOTE — Telephone Encounter (Signed)
Requested Prescriptions  Pending Prescriptions Disp Refills   meloxicam (MOBIC) 15 MG tablet [Pharmacy Med Name: MELOXICAM 15 MG TABLET] 30 tablet 0    Sig: TAKE 1 TABLET (15 MG TOTAL) BY MOUTH DAILY.     Analgesics:  COX2 Inhibitors Failed - 02/25/2023 11:02 AM      Failed - Manual Review: Labs are only required if the patient has taken medication for more than 8 weeks.      Failed - HGB in normal range and within 360 days    Hemoglobin  Date Value Ref Range Status  01/05/2022 13.0 11.1 - 15.9 g/dL Final         Failed - HCT in normal range and within 360 days    Hematocrit  Date Value Ref Range Status  01/05/2022 40.6 34.0 - 46.6 % Final         Passed - Cr in normal range and within 360 days    Creatinine  Date Value Ref Range Status  07/10/2014 0.72 mg/dL Final    Comment:    1.61-0.96 NOTE: New Reference Range  05/28/14    Creatinine, Ser  Date Value Ref Range Status  01/06/2023 0.89 0.57 - 1.00 mg/dL Final         Passed - AST in normal range and within 360 days    AST  Date Value Ref Range Status  01/06/2023 18 0 - 40 IU/L Final   SGOT(AST)  Date Value Ref Range Status  07/10/2014 20 U/L Final    Comment:    15-41 NOTE: New Reference Range  05/28/14          Passed - ALT in normal range and within 360 days    ALT  Date Value Ref Range Status  01/06/2023 14 0 - 32 IU/L Final   SGPT (ALT)  Date Value Ref Range Status  07/10/2014 21 U/L Final    Comment:    14-54 NOTE: New Reference Range  05/28/14          Passed - eGFR is 30 or above and within 360 days    EGFR (African American)  Date Value Ref Range Status  07/10/2014 >60  Final   GFR calc Af Amer  Date Value Ref Range Status  03/07/2017 >60 >60 mL/min Final    Comment:    (NOTE) The eGFR has been calculated using the CKD EPI equation. This calculation has not been validated in all clinical situations. eGFR's persistently <60 mL/min signify possible Chronic Kidney Disease.     EGFR (Non-African Amer.)  Date Value Ref Range Status  07/10/2014 >60  Final    Comment:    eGFR values <13mL/min/1.73 m2 may be an indication of chronic kidney disease (CKD). Calculated eGFR is useful in patients with stable renal function. The eGFR calculation will not be reliable in acutely ill patients when serum creatinine is changing rapidly. It is not useful in patients on dialysis. The eGFR calculation may not be applicable to patients at the low and high extremes of body sizes, pregnant women, and vegetarians.    GFR calc non Af Amer  Date Value Ref Range Status  03/07/2017 >60 >60 mL/min Final   eGFR  Date Value Ref Range Status  01/06/2023 75 >59 mL/min/1.73 Final         Passed - Patient is not pregnant      Passed - Valid encounter within last 12 months    Recent Outpatient Visits  1 month ago Encounter for annual physical exam   [place] [facility] [place], Marzella Schlein, MD   12 months ago Breast pain, left   [place] [facility] Merita Norton T, FNP   1 year ago Encounter for annual physical exam   [place] [facility] [place], Marzella Schlein, MD   1 year ago Mass of lower outer quadrant of left breast   [place] [facility] [place], Marzella Schlein, MD   2 years ago Annual physical exam   [place] [facility] [place], Marzella Schlein, MD       Future Appointments             In 10 months [place], Marzella Schlein, MD [facility], [facility]

## 2023-03-27 ENCOUNTER — Other Ambulatory Visit: Payer: Self-pay | Admitting: Family Medicine

## 2023-03-29 NOTE — Telephone Encounter (Signed)
 Requested medication (s) are due for refill today -yes  Requested medication (s) are on the active medication list -yes  Future visit scheduled -yes  Last refill: 02/28/23 #30  Notes to clinic: fails lab protocol- over 1 year-01/05/22  HGB/HCT  Requested Prescriptions  Pending Prescriptions Disp Refills   meloxicam  (MOBIC ) 15 MG tablet [Pharmacy Med Name: MELOXICAM  15 MG TABLET] 30 tablet 0    Sig: TAKE 1 TABLET (15 MG TOTAL) BY MOUTH DAILY.     Analgesics:  COX2 Inhibitors Failed - 03/29/2023  3:48 PM      Failed - Manual Review: Labs are only required if the patient has taken medication for more than 8 weeks.      Failed - HGB in normal range and within 360 days    Hemoglobin  Date Value Ref Range Status  01/05/2022 13.0 11.1 - 15.9 g/dL Final         Failed - HCT in normal range and within 360 days    Hematocrit  Date Value Ref Range Status  01/05/2022 40.6 34.0 - 46.6 % Final         Passed - Cr in normal range and within 360 days    Creatinine  Date Value Ref Range Status  07/10/2014 0.72 mg/dL Final    Comment:    9.55-8.99 NOTE: New Reference Range  05/28/14    Creatinine, Ser  Date Value Ref Range Status  01/06/2023 0.89 0.57 - 1.00 mg/dL Final         Passed - AST in normal range and within 360 days    AST  Date Value Ref Range Status  01/06/2023 18 0 - 40 IU/L Final   SGOT(AST)  Date Value Ref Range Status  07/10/2014 20 U/L Final    Comment:    15-41 NOTE: New Reference Range  05/28/14          Passed - ALT in normal range and within 360 days    ALT  Date Value Ref Range Status  01/06/2023 14 0 - 32 IU/L Final   SGPT (ALT)  Date Value Ref Range Status  07/10/2014 21 U/L Final    Comment:    14-54 NOTE: New Reference Range  05/28/14          Passed - eGFR is 30 or above and within 360 days    EGFR (African American)  Date Value Ref Range Status  07/10/2014 >60  Final   GFR calc Af Amer  Date Value Ref Range Status  03/07/2017  >60 >60 mL/min Final    Comment:    (NOTE) The eGFR has been calculated using the CKD EPI equation. This calculation has not been validated in all clinical situations. eGFR's persistently <60 mL/min signify possible Chronic Kidney Disease.    EGFR (Non-African Amer.)  Date Value Ref Range Status  07/10/2014 >60  Final    Comment:    eGFR values <21mL/min/1.73 m2 may be an indication of chronic kidney disease (CKD). Calculated eGFR is useful in patients with stable renal function. The eGFR calculation will not be reliable in acutely ill patients when serum creatinine is changing rapidly. It is not useful in patients on dialysis. The eGFR calculation may not be applicable to patients at the low and high extremes of body sizes, pregnant women, and vegetarians.    GFR calc non Af Amer  Date Value Ref Range Status  03/07/2017 >60 >60 mL/min Final   eGFR  Date Value Ref Range Status  01/06/2023 75 >59 mL/min/1.73 Final         Passed - Patient is not pregnant      Passed - Valid encounter within last 12 months    Recent Outpatient Visits           2 months ago Encounter for annual physical exam   [place] [facility] [place], Jon HERO, MD   1 year ago Breast pain, left   [facility] Emilio Kelly DASEN, FNP   1 year ago Encounter for annual physical exam   [facility] [place], Jon HERO, MD   1 year ago Mass of lower outer quadrant of left breast   [place] [facility] [place], Jon HERO, MD   2 years ago Annual physical exam   [place] [facility] [place], Jon HERO, MD       Future Appointments             In 9 months Bacigalupo, Jon HERO, MD [facility]               Requested Prescriptions  Pending Prescriptions Disp Refills   meloxicam  (MOBIC ) 15 MG tablet [Pharmacy Med Name: MELOXICAM  15 MG TABLET] 30 tablet 0     Sig: TAKE 1 TABLET (15 MG TOTAL) BY MOUTH DAILY.     Analgesics:  COX2 Inhibitors Failed - 03/29/2023  3:48 PM      Failed - Manual Review: Labs are only required if the patient has taken medication for more than 8 weeks.      Failed - HGB in normal range and within 360 days    Hemoglobin  Date Value Ref Range Status  01/05/2022 13.0 11.1 - 15.9 g/dL Final         Failed - HCT in normal range and within 360 days    Hematocrit  Date Value Ref Range Status  01/05/2022 40.6 34.0 - 46.6 % Final         Passed - Cr in normal range and within 360 days    Creatinine  Date Value Ref Range Status  07/10/2014 0.72 mg/dL Final    Comment:    9.55-8.99 NOTE: New Reference Range  05/28/14    Creatinine, Ser  Date Value Ref Range Status  01/06/2023 0.89 0.57 - 1.00 mg/dL Final         Passed - AST in normal range and within 360 days    AST  Date Value Ref Range Status  01/06/2023 18 0 - 40 IU/L Final   SGOT(AST)  Date Value Ref Range Status  07/10/2014 20 U/L Final    Comment:    15-41 NOTE: New Reference Range  05/28/14          Passed - ALT in normal range and within 360 days    ALT  Date Value Ref Range Status  01/06/2023 14 0 - 32 IU/L Final   SGPT (ALT)  Date Value Ref Range Status  07/10/2014 21 U/L Final    Comment:    14-54 NOTE: New Reference Range  05/28/14          Passed - eGFR is 30 or above and within 360 days    EGFR (African American)  Date Value Ref Range Status  07/10/2014 >60  Final   GFR calc Af Amer  Date Value Ref Range Status  03/07/2017 >60 >60 mL/min Final    Comment:    (  NOTE) The eGFR has been calculated using the CKD EPI equation. This calculation has not been validated in all clinical situations. eGFR's persistently <60 mL/min signify possible Chronic Kidney Disease.    EGFR (Non-African Amer.)  Date Value Ref Range Status  07/10/2014 >60  Final    Comment:    eGFR values <21mL/min/1.73 m2 may be an indication of  chronic kidney disease (CKD). Calculated eGFR is useful in patients with stable renal function. The eGFR calculation will not be reliable in acutely ill patients when serum creatinine is changing rapidly. It is not useful in patients on dialysis. The eGFR calculation may not be applicable to patients at the low and high extremes of body sizes, pregnant women, and vegetarians.    GFR calc non Af Amer  Date Value Ref Range Status  03/07/2017 >60 >60 mL/min Final   eGFR  Date Value Ref Range Status  01/06/2023 75 >59 mL/min/1.73 Final         Passed - Patient is not pregnant      Passed - Valid encounter within last 12 months    Recent Outpatient Visits           2 months ago Encounter for annual physical exam   [facility] [place], Jon HERO, MD   1 year ago Breast pain, left   [facility] Emilio Kelly DASEN, FNP   1 year ago Encounter for annual physical exam   [place] [facility]la, Jon HERO, MD   1 year ago Mass of lower outer quadrant of left breast   [place] [facility] [place], Jon HERO, MD   2 years ago Annual physical exam   [place] [facility] [place], Jon HERO, MD       Future Appointments             In 9 months Bacigalupo, Jon HERO, MD [facility]

## 2023-04-26 ENCOUNTER — Other Ambulatory Visit: Payer: Self-pay | Admitting: Family Medicine

## 2023-05-18 DIAGNOSIS — Z79899 Other long term (current) drug therapy: Secondary | ICD-10-CM | POA: Diagnosis not present

## 2023-05-18 DIAGNOSIS — K5903 Drug induced constipation: Secondary | ICD-10-CM | POA: Diagnosis not present

## 2023-05-18 DIAGNOSIS — M79671 Pain in right foot: Secondary | ICD-10-CM | POA: Diagnosis not present

## 2023-05-18 DIAGNOSIS — M25551 Pain in right hip: Secondary | ICD-10-CM | POA: Diagnosis not present

## 2023-05-18 DIAGNOSIS — M7061 Trochanteric bursitis, right hip: Secondary | ICD-10-CM | POA: Diagnosis not present

## 2023-05-18 DIAGNOSIS — R252 Cramp and spasm: Secondary | ICD-10-CM | POA: Diagnosis not present

## 2023-05-18 DIAGNOSIS — K5900 Constipation, unspecified: Secondary | ICD-10-CM | POA: Diagnosis not present

## 2023-05-18 DIAGNOSIS — S9031XA Contusion of right foot, initial encounter: Secondary | ICD-10-CM | POA: Diagnosis not present

## 2023-05-18 DIAGNOSIS — G894 Chronic pain syndrome: Secondary | ICD-10-CM | POA: Diagnosis not present

## 2023-05-18 DIAGNOSIS — M1711 Unilateral primary osteoarthritis, right knee: Secondary | ICD-10-CM | POA: Diagnosis not present

## 2023-05-18 DIAGNOSIS — G8918 Other acute postprocedural pain: Secondary | ICD-10-CM | POA: Diagnosis not present

## 2023-05-25 ENCOUNTER — Other Ambulatory Visit: Payer: Self-pay | Admitting: Family Medicine

## 2023-05-25 NOTE — Telephone Encounter (Signed)
 Requested medication (s) are due for refill today: Yes  Requested medication (s) are on the active medication list: Yes  Last refill:  04/26/23  Future visit scheduled: Yes  Notes to clinic:  Unable to refill per protocol due to failed labs, no updated results.      Requested Prescriptions  Pending Prescriptions Disp Refills   meloxicam (MOBIC) 15 MG tablet [Pharmacy Med Name: MELOXICAM 15 MG TABLET] 30 tablet 0    Sig: TAKE 1 TABLET (15 MG TOTAL) BY MOUTH DAILY.     Analgesics:  COX2 Inhibitors Failed - 05/25/2023  2:48 PM      Failed - Manual Review: Labs are only required if the patient has taken medication for more than 8 weeks.      Failed - HGB in normal range and within 360 days    Hemoglobin  Date Value Ref Range Status  01/05/2022 13.0 11.1 - 15.9 g/dL Final         Failed - HCT in normal range and within 360 days    Hematocrit  Date Value Ref Range Status  01/05/2022 40.6 34.0 - 46.6 % Final         Passed - Cr in normal range and within 360 days    Creatinine  Date Value Ref Range Status  07/10/2014 0.72 mg/dL Final    Comment:    1.61-0.96 NOTE: New Reference Range  05/28/14    Creatinine, Ser  Date Value Ref Range Status  01/06/2023 0.89 0.57 - 1.00 mg/dL Final         Passed - AST in normal range and within 360 days    AST  Date Value Ref Range Status  01/06/2023 18 0 - 40 IU/L Final   SGOT(AST)  Date Value Ref Range Status  07/10/2014 20 U/L Final    Comment:    15-41 NOTE: New Reference Range  05/28/14          Passed - ALT in normal range and within 360 days    ALT  Date Value Ref Range Status  01/06/2023 14 0 - 32 IU/L Final   SGPT (ALT)  Date Value Ref Range Status  07/10/2014 21 U/L Final    Comment:    14-54 NOTE: New Reference Range  05/28/14          Passed - eGFR is 30 or above and within 360 days    EGFR (African American)  Date Value Ref Range Status  07/10/2014 >60  Final   GFR calc Af Amer  Date Value Ref  Range Status  03/07/2017 >60 >60 mL/min Final    Comment:    (NOTE) The eGFR has been calculated using the CKD EPI equation. This calculation has not been validated in all clinical situations. eGFR's persistently <60 mL/min signify possible Chronic Kidney Disease.    EGFR (Non-African Amer.)  Date Value Ref Range Status  07/10/2014 >60  Final    Comment:    eGFR values <68mL/min/1.73 m2 may be an indication of chronic kidney disease (CKD). Calculated eGFR is useful in patients with stable renal function. The eGFR calculation will not be reliable in acutely ill patients when serum creatinine is changing rapidly. It is not useful in patients on dialysis. The eGFR calculation may not be applicable to patients at the low and high extremes of body sizes, pregnant women, and vegetarians.    GFR calc non Af Amer  Date Value Ref Range Status  03/07/2017 >60 >60 mL/min Final  eGFR  Date Value Ref Range Status  01/06/2023 75 >59 mL/min/1.73 Final         Passed - Patient is not pregnant      Passed - Valid encounter within last 12 months    Recent Outpatient Visits           4 months ago Encounter for annual physical exam   [place] [facility] [place], Marzella Schlein, MD   1 year ago Breast pain, left   [place] [facility] Jacky Kindle, FNP   1 year ago Encounter for annual physical exam   [place] [facility] [place], Marzella Schlein, MD   2 years ago Mass of lower outer quadrant of left breast   [place] [facility] [place], Marzella Schlein, MD   2 years ago Annual physical exam   [facility] [place], Marzella Schlein, MD       Future Appointments             In 7 months Bacigalupo, Marzella Schlein, MD [facility]

## 2023-06-15 ENCOUNTER — Encounter

## 2023-07-13 DIAGNOSIS — Z79899 Other long term (current) drug therapy: Secondary | ICD-10-CM | POA: Diagnosis not present

## 2023-07-13 DIAGNOSIS — G8918 Other acute postprocedural pain: Secondary | ICD-10-CM | POA: Diagnosis not present

## 2023-07-13 DIAGNOSIS — G894 Chronic pain syndrome: Secondary | ICD-10-CM | POA: Diagnosis not present

## 2023-07-13 DIAGNOSIS — M1711 Unilateral primary osteoarthritis, right knee: Secondary | ICD-10-CM | POA: Diagnosis not present

## 2023-07-13 DIAGNOSIS — S9031XA Contusion of right foot, initial encounter: Secondary | ICD-10-CM | POA: Diagnosis not present

## 2023-07-13 DIAGNOSIS — M7061 Trochanteric bursitis, right hip: Secondary | ICD-10-CM | POA: Diagnosis not present

## 2023-07-13 DIAGNOSIS — R252 Cramp and spasm: Secondary | ICD-10-CM | POA: Diagnosis not present

## 2023-07-13 DIAGNOSIS — M545 Low back pain, unspecified: Secondary | ICD-10-CM | POA: Diagnosis not present

## 2023-07-13 DIAGNOSIS — K5903 Drug induced constipation: Secondary | ICD-10-CM | POA: Diagnosis not present

## 2023-11-19 ENCOUNTER — Other Ambulatory Visit: Payer: Self-pay | Admitting: Family Medicine

## 2024-01-12 ENCOUNTER — Encounter: Payer: Self-pay | Admitting: Family Medicine

## 2024-01-12 ENCOUNTER — Ambulatory Visit
Admission: RE | Admit: 2024-01-12 | Discharge: 2024-01-12 | Disposition: A | Discharge status: Home / Self Care | Attending: Family Medicine | Admitting: Family Medicine

## 2024-01-12 ENCOUNTER — Ambulatory Visit (INDEPENDENT_AMBULATORY_CARE_PROVIDER_SITE_OTHER): Payer: Self-pay | Admitting: Family Medicine

## 2024-01-12 ENCOUNTER — Ambulatory Visit
Admission: RE | Admit: 2024-01-12 | Discharge: 2024-01-12 | Disposition: A | Discharge status: Home / Self Care | Source: Ambulatory Visit | Attending: Family Medicine | Admitting: Family Medicine

## 2024-01-12 VITALS — BP 116/70 | HR 76 | Ht 65.0 in | Wt 151.1 lb

## 2024-01-12 DIAGNOSIS — G8929 Other chronic pain: Secondary | ICD-10-CM

## 2024-01-12 DIAGNOSIS — M25561 Pain in right knee: Secondary | ICD-10-CM | POA: Insufficient documentation

## 2024-01-12 DIAGNOSIS — Z0000 Encounter for general adult medical examination without abnormal findings: Secondary | ICD-10-CM

## 2024-01-12 DIAGNOSIS — I10 Essential (primary) hypertension: Secondary | ICD-10-CM

## 2024-01-12 DIAGNOSIS — Z23 Encounter for immunization: Secondary | ICD-10-CM

## 2024-01-12 DIAGNOSIS — R7303 Prediabetes: Secondary | ICD-10-CM | POA: Diagnosis not present

## 2024-01-12 DIAGNOSIS — M7651 Patellar tendinitis, right knee: Secondary | ICD-10-CM | POA: Diagnosis not present

## 2024-01-12 NOTE — Progress Notes (Signed)
 Complete physical exam   Patient: Jacqueline Bowman   DOB: March 28, 1965   58 y.o. Female  MRN: [id number] Visit Date: 01/12/2024  Today's healthcare provider: Jon Eva, MD   Chief Complaint  Patient presents with   Annual Exam    Last completed 01/06/23 Diet - healthy Exercise - staying active Feeling - fairly well due to problems with knee Sleeping - fairly well due to knee Concerns -  imaging on right knee if possible due to contact aches   Subjective    Jacqueline Bowman is a 58 y.o. female who presents today for a complete physical exam.    Discussed the use of AI scribe software for clinical note transcription with the patient, who gave verbal consent to proceed.  History of Present Illness   Jacqueline Bowman is a 58 year old female who presents for an annual physical exam.  She experiences increased aching in her right knee over the past couple of months, with intensified pain at night affecting her sleep. There is morning stiffness. She uses various creams and is under the care of a pain management specialist, with an upcoming appointment next month. An x-ray was performed, but results are pending.  She has lost about 20 pounds since her last visit and is actively engaging in exercises.  She has hypertension with a recent blood pressure reading of 116/70 and is not currently on medication.  She has undergone bilateral mastectomy for breast cancer. Her last colonoscopy was recent, with the next due in 2033.  She has received shingles and tetanus vaccinations at a pharmacy.        Last depression screening scores    01/12/2024    2:01 PM 01/06/2023    2:32 PM 06/14/2022   10:27 AM  PHQ 2/9 Scores  PHQ - 2 Score  0 0  PHQ- 9 Score  0   Exception Documentation Patient refusal     Last fall risk screening    01/06/2023    2:32 PM  Fall Risk   Falls in the past year? 1  Number falls in past yr: 0  Injury with Fall? 1  Risk for fall due to : No Fall  Risks  Follow up Falls evaluation completed        Medications: Outpatient Medications Prior to Visit  Medication Sig   cetirizine  (ZYRTEC ) 10 MG tablet TAKE 1 TABLET BY MOUTH EVERY DAY (Patient taking differently: Taking as needed)   diclofenac Sodium (VOLTAREN) 1 % GEL Apply 1 application topically daily as needed (pain).   fluticasone  (FLONASE ) 50 MCG/ACT nasal spray Place into the nose.   meloxicam  (MOBIC ) 15 MG tablet TAKE 1 TABLET (15 MG TOTAL) BY MOUTH DAILY.   Multiple Vitamins-Minerals (MULTIVITAMIN WITH MINERALS) tablet Take 1 tablet by mouth daily. Complete   ondansetron  (ZOFRAN -ODT) 4 MG disintegrating tablet Take 1 tablet (4 mg total) by mouth every 8 (eight) hours as needed for nausea or vomiting.   oxyCODONE -acetaminophen  (PERCOCET) 10-325 MG tablet Take 1 tablet by mouth every 4 (four) hours as needed for pain.   topiramate (TOPAMAX) 50 MG tablet Take 50 mg by mouth daily.   [DISCONTINUED] chlorhexidine  (PERIDEX ) 0.12 % solution Use as directed 15 mLs in the mouth or throat 2 (two) times daily. (Patient not taking: Reported on 01/12/2024)   No facility-administered medications prior to visit.    Review of Systems    Objective    BP 116/70 (BP Location: Left Arm, Patient Position:  Sitting, Cuff Size: Normal)   Pulse 76   Ht 5' 5 (1.651 m)   Wt 151 lb 1.6 oz (68.5 kg)   SpO2 100%   BMI 25.14 kg/m    Physical Exam Vitals reviewed.  Constitutional:      General: She is not in acute distress.    Appearance: Normal appearance. She is well-developed. She is not diaphoretic.  HENT:     Head: Normocephalic and atraumatic.     Right Ear: Tympanic membrane, ear canal and external ear normal.     Left Ear: Tympanic membrane, ear canal and external ear normal.     Nose: Nose normal.     Mouth/Throat:     Mouth: Mucous membranes are moist.     Pharynx: Oropharynx is clear. No oropharyngeal exudate.  Eyes:     General: No scleral icterus.    Conjunctiva/sclera:  Conjunctivae normal.     Pupils: Pupils are equal, round, and reactive to light.  Neck:     Thyroid: No thyromegaly.  Cardiovascular:     Rate and Rhythm: Normal rate and regular rhythm.     Heart sounds: Normal heart sounds. No murmur heard. Pulmonary:     Effort: Pulmonary effort is normal. No respiratory distress.     Breath sounds: Normal breath sounds. No wheezing or rales.  Abdominal:     General: There is no distension.     Palpations: Abdomen is soft.     Tenderness: There is no abdominal tenderness.  Musculoskeletal:        General: No deformity.     Cervical back: Neck supple.     Right lower leg: No edema.     Left lower leg: No edema.     Comments: TTP over R patellar tendon with crepitus  Lymphadenopathy:     Cervical: No cervical adenopathy.  Skin:    General: Skin is warm and dry.     Findings: No rash.  Neurological:     Mental Status: She is alert and oriented to person, place, and time. Mental status is at baseline.     Gait: Gait normal.  Psychiatric:        Mood and Affect: Mood normal.        Behavior: Behavior normal.        Thought Content: Thought content normal.      No results found for any visits on 01/12/24.  Assessment & Plan    Routine Health Maintenance and Physical Exam  Exercise Activities and Dietary recommendations  Goals      DIET - EAT MORE FRUITS AND VEGETABLES        Immunization History  Administered Date(s) Administered   Influenza, Seasonal, Injecte, Preservative Fre 01/06/2023, 01/12/2024   Influenza,inj,Quad PF,6+ Mos 12/30/2021   Influenza-Unspecified 03/09/2021, 12/30/2021   Moderna Covid-19 Vaccine Bivalent Booster 86yrs & up 03/09/2021   Moderna Sars-Covid-2 Vaccination 05/31/2019, 07/04/2019, 12/14/2019   Novel Infuenza-h1n1-09 02/28/2008   PNEUMOCOCCAL CONJUGATE-20 01/12/2024   Tdap 05/15/2010   Varicella 12/30/2021   Zoster Recombinant(Shingrix) 12/30/2021    Health Maintenance  Topic Date Due    DTaP/Tdap/Td (2 - Td or Tdap) 05/15/2020   Zoster Vaccines- Shingrix (2 of 2) 02/24/2022   Medicare Annual Wellness (AWV)  06/14/2023   COVID-19 Vaccine (5 - 2025-26 season) 11/21/2023   Colonoscopy  06/27/2031   Pneumococcal Vaccine: 50+ Years  Completed   Influenza Vaccine  Completed   Hepatitis C Screening  Completed   HIV Screening  Completed  Hepatitis B Vaccines 19-59 Average Risk  Aged Out   HPV VACCINES  Aged Out   Meningococcal B Vaccine  Aged Out   Mammogram  Discontinued    Discussed health benefits of physical activity, and encouraged her to engage in regular exercise appropriate for her age and condition.  Problem List Items Addressed This Visit       Cardiovascular and Mediastinum   Essential hypertension   Hypertension is well-controlled without medication. Blood pressure recorded at 116/70 mmHg. - Continue current management without antihypertensive medication - Monitor blood pressure annually unless issues arise      Relevant Orders   Hemoglobin A1c   Comprehensive metabolic panel with GFR   Lipid panel     Other   Prediabetes   Recommend low carb diet Recheck A1c       Relevant Orders   Hemoglobin A1c   Comprehensive metabolic panel with GFR   Lipid panel   Other Visit Diagnoses       Encounter for annual physical exam    -  Primary   Relevant Orders   Hemoglobin A1c   Comprehensive metabolic panel with GFR   Lipid panel     Immunization due       Relevant Orders   Flu vaccine trivalent PF, 6mos and older(Flulaval,Afluria,Fluarix,Fluzone) (Completed)   Pneumococcal conjugate vaccine 20-valent (Completed)     Chronic pain of right knee       Relevant Orders   DG Knee Complete 4 Views Right     Patellar tendinitis of right knee              Right knee pain after knee replacement with patellar tendinitis and crepitus Right knee pain with increased aching and stiffness, especially at night. History of bilateral knee replacement. Suspected  patellar tendinitis with crepitus. Differential includes hardware issues from knee replacement. - Order x-ray of right knee at outpatient imaging center - Recommend use of Voltaren gel up to four times daily for patellar tendinitis - Evaluate x-ray results for hardware issues  Bilateral mastectomy for breast cancer Bilateral mastectomy with implants. Breast exam performed with no abnormalities detected. - Perform annual breast examination  Adult wellness visit Routine adult wellness visit conducted. Blood pressure is well-controlled at 116/70 mmHg. No new complaints aside from right knee pain. Labs from last year showed improvement in cholesterol and A1c levels. - Perform head-to-toe physical examination - Order routine labs including cholesterol and A1c - Schedule annual wellness visit with nurse  Immunizations Due for pneumonia and flu vaccinations. Previous shingles and tetanus vaccinations were administered at a pharmacy. COVID booster discussed, but not administered today. - Administer pneumonia and flu vaccinations - Advise to consider COVID booster at pharmacy after a few weeks        Return in about 1 year (around 01/11/2025) for CPE and AWV with NHA this year.     Jon Eva, MD  [facility] [phone] (phone) [phone] (fax)  [facility]

## 2024-01-12 NOTE — Patient Instructions (Signed)
Voltaren gel for your knee.

## 2024-01-12 NOTE — Assessment & Plan Note (Signed)
 Hypertension is well-controlled without medication. Blood pressure recorded at 116/70 mmHg. - Continue current management without antihypertensive medication - Monitor blood pressure annually unless issues arise

## 2024-01-12 NOTE — Assessment & Plan Note (Signed)
 Recommend low carb diet Recheck A1c

## 2024-01-13 ENCOUNTER — Ambulatory Visit: Payer: Self-pay | Admitting: Family Medicine

## 2024-01-13 LAB — HEMOGLOBIN A1C
Est. average glucose Bld gHb Est-mCnc: 100 mg/dL
Hgb A1c MFr Bld: 5.1 % (ref 4.8–5.6)

## 2024-01-13 LAB — COMPREHENSIVE METABOLIC PANEL WITH GFR
ALT: 14 IU/L (ref 0–32)
AST: 15 IU/L (ref 0–40)
Albumin: 4.3 g/dL (ref 3.8–4.9)
Alkaline Phosphatase: 107 IU/L (ref 49–135)
BUN/Creatinine Ratio: 22 (ref 12–28)
BUN: 15 mg/dL (ref 8–27)
Bilirubin Total: <0.2 mg/dL (ref 0.0–1.2)
CO2: 23 mmol/L (ref 20–29)
Calcium: 9.8 mg/dL (ref 8.7–10.3)
Chloride: 107 mmol/L — ABNORMAL HIGH (ref 96–106)
Creatinine, Ser: 0.68 mg/dL (ref 0.57–1.00)
Globulin, Total: 2.7 g/dL (ref 1.5–4.5)
Glucose: 69 mg/dL — ABNORMAL LOW (ref 70–99)
Potassium: 3.8 mmol/L (ref 3.5–5.2)
Sodium: 143 mmol/L (ref 134–144)
Total Protein: 7 g/dL (ref 6.0–8.5)
eGFR: 100 mL/min/1.73 (ref >59)

## 2024-01-13 LAB — LIPID PANEL
Chol/HDL Ratio: 2.2 ratio (ref 0.0–4.4)
Cholesterol, Total: 245 mg/dL — ABNORMAL HIGH (ref 100–199)
HDL: 111 mg/dL (ref >39)
LDL Chol Calc (NIH): 121 mg/dL — ABNORMAL HIGH (ref 0–99)
Triglycerides: 79 mg/dL (ref 0–149)
VLDL Cholesterol Cal: 13 mg/dL (ref 5–40)

## 2024-01-17 ENCOUNTER — Telehealth: Payer: Self-pay

## 2024-01-17 NOTE — Telephone Encounter (Signed)
 Copied from CRM #[id number]. Topic: Clinical - Lab/Test Results >> Jan 17, 2024 10:20 AM Tonda B wrote: Reason for CRM: patient is calling to go over her test results please call pt back [phone]

## 2024-01-17 NOTE — Telephone Encounter (Signed)
 Spoke with pt regarding lab results and xray results. Pt verbalized understanding.

## 2024-01-20 ENCOUNTER — Ambulatory Visit: Payer: Self-pay

## 2024-01-20 NOTE — Telephone Encounter (Signed)
 This RN made first attempt to triage patient. No answer, unable to LVM due to full VMB. Routing for additional attempts.   Copied from CRM [id number]. Topic: Clinical - Medical Advice >> Jan 20, 2024 10:36 AM Amy B wrote: Reason for CRM: Patient states her urine has odor and she also has vaginal odor.  She has not been sexually active in years and pap test is normal.  She is worried and requests a call back to discuss.  [phone]

## 2024-01-20 NOTE — Telephone Encounter (Signed)
 FYI Only or Action Required?: Action required by provider: request for appointment.  Patient was last seen in primary care on 01/12/2024 by Myrla Jon HERO, MD.  Called Nurse Triage reporting Vaginal Odor.  Symptoms began several months ago.  Interventions attempted: Nothing.  Symptoms are: unchanged.  Triage Disposition: See PCP When Office is Open (Within 3 Days)  Patient/caregiver understands and will follow disposition?: Yes                            Reason for Disposition  [1] Vaginal odor (bad smell) AND [2] not improved > 3 days following Care Advice  Answer Assessment - Initial Assessment Questions 1. SYMPTOM: What's the main symptom you're concerned about? (e.g., pain, itching, dryness)     Vaginal odor 2. LOCATION: Where are symptoms located? (e.g., inside/outside, left/right)     N/A 3. ONSET: When did the vaginal odor start?     Comes and goes, has noticed for past 2-3 months, denies worsening 4. PAIN: Is there any pain? If Yes, ask: How bad is it? (Scale: 1-10; mild, moderate, severe)     Denies 5. ITCHING: Is there any itching? If Yes, ask: How bad is it? (Scale: 1-10; mild, moderate, severe)     Denies 6. CAUSE: What do you think is causing the discharge? Have you had the same problem before? What happened then?     Unsure 7. OTHER SYMPTOMS: Do you have any other symptoms? (e.g., fever, itching, vaginal bleeding, pain with urination, injury to genital area, vaginal foreign body)     Odor with urine, denies discharge, denies painful urination, denies abdominal pain, denies bleeding 8. PREGNANCY: Is there any chance you are pregnant? When was your last menstrual period?     N/A    Advised appointment with PCP, as this has been an ongoing issue and patient denied worsening symptoms. No availability with PCP until 03/06/24. Please advise on scheduling. Patient would like a call back.  Protocols used: Vaginal  Symptoms-A-AH

## 2024-01-23 NOTE — Telephone Encounter (Signed)
 Appt scheduled for 01/24/24 at 3:40pm.

## 2024-01-23 NOTE — Telephone Encounter (Signed)
 There are appts available tomorrow with me. Alternatively, there may be other acutes open with other providers if she wants to be seen today.

## 2024-01-24 ENCOUNTER — Other Ambulatory Visit (HOSPITAL_COMMUNITY)
Admission: RE | Admit: 2024-01-24 | Discharge: 2024-01-24 | Disposition: A | Discharge status: Home / Self Care | Source: Ambulatory Visit | Attending: Family Medicine | Admitting: Family Medicine

## 2024-01-24 ENCOUNTER — Encounter: Payer: Self-pay | Admitting: Family Medicine

## 2024-01-24 ENCOUNTER — Ambulatory Visit (INDEPENDENT_AMBULATORY_CARE_PROVIDER_SITE_OTHER): Admitting: Family Medicine

## 2024-01-24 VITALS — BP 123/83 | HR 76 | Ht 65.0 in | Wt 152.6 lb

## 2024-01-24 DIAGNOSIS — N898 Other specified noninflammatory disorders of vagina: Secondary | ICD-10-CM

## 2024-01-24 DIAGNOSIS — R140 Abdominal distension (gaseous): Secondary | ICD-10-CM | POA: Diagnosis not present

## 2024-01-24 NOTE — Progress Notes (Signed)
      Acute visit   Patient: Jacqueline Bowman   DOB: 1965-06-28   58 y.o. Female  MRN: [id number] PCP: Myrla Jon HERO, MD   Chief Complaint  Patient presents with   Acute Visit    Patient reports she doesn't feel fresh no matter how many times she is showering. No concerns with discharge. Strong odor. Not sexually active. Symptoms present at ;east a month or so. Reports using scented dove and scented vaginal wash.   Subjective    Discussed the use of AI scribe software for clinical note transcription with the patient, who gave verbal consent to proceed.  History of Present Illness   SLYVIA Bowman is a 58 year old female who presents with concerns of not feeling fresh and occasional odor.  She experiences a sensation of not feeling fresh despite regular washing and showering, with occasional slight odor. She uses a feminine wash, which she suspects may contribute to the issue. There is no discharge, urinary symptoms, or abdominal pain.        Review of Systems  Objective    BP 123/83 (BP Location: Left Arm, Patient Position: Sitting, Cuff Size: Normal)   Pulse 76   Ht 5' 5 (1.651 m)   Wt 152 lb 9.6 oz (69.2 kg)   SpO2 100%   BMI 25.39 kg/m    Physical Exam Genitourinary:    Comments: GYN:  External genitalia within normal limits.  Vaginal mucosa pink, moist, normal rugae.  Nonfriable cervix without lesions, no discharge or bleeding noted on speculum exam.    Chaperone present       No results found for any visits on 01/24/24.  Assessment & Plan     Problem List Items Addressed This Visit   None Visit Diagnoses       Vaginal odor    -  Primary   Relevant Orders   Cervicovaginal ancillary only     Sensation of gaseous abdominal fullness              Vaginal odor without infection Intermittent vaginal odor without discharge or urinary symptoms. No abdominal pain or rashes. Likely due to disruption of vaginal pH balance from use of feminine washes. No  signs of infection on examination. - Discontinue use of feminine washes. - Use gentle, unscented soap like Dove for external cleaning only. - Consider taking a probiotic or eating yogurt to restore vaginal flora. - Performed swab to rule out yeast or bacterial vaginosis. - Sent swab for analysis and will communicate results via MyChart.  Increased intestinal gas Reports increased intestinal gas without specific dietary triggers identified. Possible changes in bowel processing with age. - Monitor dietary intake to identify potential triggers. - Consider using Gas-X or generic simethicone for symptomatic relief.       Return if symptoms worsen or fail to improve.      Jon Myrla, MD  [facility] [phone] (phone) [phone] (fax)  [facility]

## 2024-01-26 ENCOUNTER — Ambulatory Visit: Payer: Self-pay | Admitting: Family Medicine

## 2024-01-26 ENCOUNTER — Ambulatory Visit: Payer: Self-pay

## 2024-01-26 LAB — CERVICOVAGINAL ANCILLARY ONLY
Bacterial Vaginitis (gardnerella): POSITIVE — AB
Candida Glabrata: NEGATIVE
Candida Vaginitis: NEGATIVE
Chlamydia: NEGATIVE
Comment: NEGATIVE
Comment: NEGATIVE
Comment: NEGATIVE
Comment: NEGATIVE
Comment: NEGATIVE
Comment: NORMAL
Neisseria Gonorrhea: NEGATIVE
Trichomonas: NEGATIVE

## 2024-01-26 MED ORDER — METRONIDAZOLE 500 MG PO TABS: 500.0000 mg | ORAL_TABLET | Freq: Two times a day (BID) | ORAL | 0 refills | Status: DC

## 2024-01-26 NOTE — Telephone Encounter (Signed)
 FYI Only or Action Required?: FYI only for provider: n/a.  Patient was last seen in primary care on 01/24/2024 by Myrla Jon HERO, MD.  Called Nurse Triage reporting Lab Results.  Symptoms began n/a.  Interventions attempted: Other: n/a.  Symptoms are: n/a.  Triage Disposition: Information or Advice Only Call  Patient/caregiver understands and will follow disposition?: Yes Reason for Disposition  Health information question, no triage required and triager able to answer question  Answer Assessment - Initial Assessment Questions Educated patient on Flagyl, Bacterial vaginosis and proper vaginal hygiene.   1. REASON FOR CALL: What is the main reason for your call? or How can I best help you?     Calling to get clarification on what Flagyl is, why she's being prescribed it and what BV is.  Protocols used: Information Only Call - No Triage-A-AH  Copied from CRM [id number]. Topic: Clinical - Lab/Test Results >> Jan 26, 2024  2:34 PM Tobias L wrote: Reason for CRM: Relayed results to patient. Has further questions.

## 2024-02-29 ENCOUNTER — Other Ambulatory Visit: Payer: Self-pay | Admitting: Family Medicine

## 2024-03-21 ENCOUNTER — Ambulatory Visit

## 2024-04-10 ENCOUNTER — Ambulatory Visit

## 2024-04-10 DIAGNOSIS — Z0000 Encounter for general adult medical examination without abnormal findings: Secondary | ICD-10-CM | POA: Diagnosis not present

## 2024-04-10 NOTE — Progress Notes (Signed)
**Note Jacqueline-Identified via Obfuscation**  "  Chief Complaint  Patient presents with   Medicare Wellness     Subjective:   Jacqueline Bowman is a 59 y.o. female who presents for a Medicare Annual Wellness Visit.  Visit info / Clinical Intake: Medicare Wellness Visit Type:: Subsequent Annual Wellness Visit Persons participating in visit and providing information:: patient Medicare Wellness Visit Mode:: Telephone If telephone:: video declined Since this visit was completed virtually, some vitals may be partially provided or unavailable. Missing vitals are due to the limitations of the virtual format.: Unable to obtain vitals - no equipment If Telephone or Video please confirm:: I connected with patient using audio/video enable telemedicine. I verified patient identity with two identifiers, discussed telehealth limitations, and patient agreed to proceed. Patient Location:: HOME Provider Location:: OFFICE Interpreter Needed?: No Pre-visit prep was completed: yes AWV questionnaire completed by patient prior to visit?: no Living arrangements:: (!) lives alone Patient's Overall Health Status Rating: (!) fair Typical amount of pain: some (KNEE PAIN) Does pain affect daily life?: (!) yes Are you currently prescribed opioids?: (!) yes  Dietary Habits and Nutritional Risks How many meals a day?: 2 Eats fruit and vegetables daily?: yes Most meals are obtained by: having others provide food In the last 2 weeks, have you had any of the following?: none Diabetic:: no  Functional Status Activities of Daily Living (to include ambulation/medication): Independent Ambulation: Independent Medication Administration: Independent Home Management (perform basic housework or laundry): Independent Manage your own finances?: yes Primary transportation is: family / friends Concerns about vision?: no *vision screening is required for [facility]* ([facility]- [facility]) Concerns about hearing?: no  Fall Screening Falls in the past year?: 0 Number of  falls in past year: 0 Was there an injury with Fall?: 0 Fall Risk Category Calculator: 0 Patient Fall Risk Level: Low Fall Risk  Fall Risk Patient at Risk for Falls Due to: No Fall Risks Fall risk Follow up: Falls evaluation completed; Follow up appointment  Home and Transportation Safety: All rugs have non-skid backing?: yes All stairs or steps have railings?: N/A, no stairs Grab bars in the bathtub or shower?: (!) no Have non-skid surface in bathtub or shower?: yes Good home lighting?: yes Regular seat belt use?: yes Hospital stays in the last year:: no  Cognitive Assessment Difficulty concentrating, remembering, or making decisions? : no Will 6CIT or Mini Cog be Completed: yes What year is it?: 0 points What month is it?: 0 points Give patient an address phrase to remember (5 components): [address] [place], [place] what time is it?: 0 points Count backwards from 20 to 1: 0 points Say the months of the year in reverse: 4 points Repeat the address phrase from earlier: 0 points 6 CIT Score: 4 points  Advance Directives (For Healthcare) Does Patient Have a Medical Advance Directive?: No Would patient like information on creating a medical advance directive?: No - Patient declined  Reviewed/Updated  Reviewed/Updated: Reviewed All (Medical, Surgical, Family, Medications, Allergies, Care Teams, Patient Goals)    Allergies (verified) Patient has no known allergies.   Current Medications (verified) Outpatient Encounter Medications as of 04/10/2024  Medication Sig   cetirizine  (ZYRTEC ) 10 MG tablet TAKE 1 TABLET BY MOUTH EVERY DAY (Patient taking differently: TAKES PRN)   diclofenac Sodium (VOLTAREN) 1 % GEL Apply 1 application topically daily as needed (pain).   fluticasone  (FLONASE ) 50 MCG/ACT nasal spray Place into the nose.   meloxicam  (MOBIC ) 15 MG tablet TAKE 1 TABLET (15 MG TOTAL) BY  MOUTH DAILY.   Multiple Vitamins-Minerals (MULTIVITAMIN WITH MINERALS) tablet  Take 1 tablet by mouth daily. Complete   naproxen (EC NAPROSYN) 500 MG EC tablet Take 500 mg by mouth 2 (two) times daily with a meal.   ondansetron  (ZOFRAN -ODT) 4 MG disintegrating tablet Take 1 tablet (4 mg total) by mouth every 8 (eight) hours as needed for nausea or vomiting.   oxyCODONE -acetaminophen  (PERCOCET) 10-325 MG tablet Take 1 tablet by mouth every 4 (four) hours as needed for pain.   topiramate (TOPAMAX) 50 MG tablet Take 50 mg by mouth daily.   [DISCONTINUED] metroNIDAZOLE  (FLAGYL ) 500 MG tablet Take 1 tablet (500 mg total) by mouth 2 (two) times daily.   No facility-administered encounter medications on file as of 04/10/2024.    History: Past Medical History:  Diagnosis Date   Breast cancer (HCC)    January 2000 left   Hypertension    Past Surgical History:  Procedure Laterality Date   ABDOMINAL HYSTERECTOMY  2004   BREAST SURGERY     COLONOSCOPY WITH PROPOFOL  N/A 06/26/2021   Procedure: COLONOSCOPY WITH PROPOFOL ;  Surgeon: Therisa Bi, MD;  Location: [facility] ENDOSCOPY;  Service: Gastroenterology;  Laterality: N/A;   FOREIGN BODY REMOVAL N/A 04/18/2020   Procedure: FOREIGN BODY REMOVAL ADULT, exploration of umbilicus;  Surgeon: Marolyn Nest, MD;  Location: [facility] ORS;  Service: General;  Laterality: N/A;   FRACTURE SURGERY  2018   right foot to correct knee   MASTECTOMY Bilateral 2004   TOTAL KNEE ARTHROPLASTY Bilateral 2018   2018 left 2019 right   Family History  Problem Relation Age of Onset   Breast cancer Mother 9   Hypertension Father    Hyperlipidemia Father    Obesity Father    Colon cancer Brother 20   Heart disease Brother    Social History   Occupational History   Occupation: disabled  Tobacco Use   Smoking status: Former    Types: Cigarettes   Smokeless tobacco: Never   Tobacco comments:    socailly smoking for a few years in the distant past  Vaping Use   Vaping status: Never Used  Substance and Sexual Activity   Alcohol use: Not Currently     Comment: socially   Drug use: No   Sexual activity: Not Currently   Tobacco Counseling Counseling given: Not Answered Tobacco comments: socailly smoking for a few years in the distant past  SDOH Screenings   Food Insecurity: No Food Insecurity (04/10/2024)  Housing: Unknown (04/10/2024)  Transportation Needs: No Transportation Needs (04/10/2024)  Utilities: Not At Risk (04/10/2024)  Alcohol Screen: Low Risk (06/14/2022)  Depression (PHQ2-9): Low Risk (04/10/2024)  Financial Resource Strain: Low Risk (06/14/2022)  Physical Activity: Inactive (04/10/2024)  Social Connections: Moderately Integrated (04/10/2024)  Stress: No Stress Concern Present (04/10/2024)  Tobacco Use: Medium Risk (04/10/2024)  Health Literacy: Adequate Health Literacy (04/10/2024)   See flowsheets for full screening details  Depression Screen Depression Screening Exception Documentation Depression Screening Exception:: Patient refusal  PHQ 2 & 9 Depression Scale- Over the past 2 weeks, how often have you been bothered by any of the following problems? Little interest or pleasure in doing things: 1 Feeling down, depressed, or hopeless (PHQ Adolescent also includes...irritable): 1 PHQ-2 Total Score: 2 Trouble falling or staying asleep, or sleeping too much: 0 Feeling tired or having little energy: 1 Poor appetite or overeating (PHQ Adolescent also includes...weight loss): 0 Feeling bad about yourself - or that you are a failure or have let yourself or  your family down: 0 Trouble concentrating on things, such as reading the newspaper or watching television [facility] Adolescent also includes...like school work): 0 Moving or speaking so slowly that other people could have noticed. Or the opposite - being so fidgety or restless that you have been moving around a lot more than usual: 0 Thoughts that you would be better off dead, or of hurting yourself in some way: 0 PHQ-9 Total Score: 3 If you checked off any problems, how  difficult have these problems made it for you to do your work, take care of things at home, or get along with other people?: Not difficult at all  Depression Treatment Depression Interventions/Treatment : EYV7-0 Score <4 Follow-up Not Indicated     Goals Addressed             This Visit's Progress    DIET - INCREASE WATER INTAKE               Objective:    There were no vitals filed for this visit. There is no height or weight on file to calculate BMI.  Hearing/Vision screen No results found. Immunizations and Health Maintenance Health Maintenance  Topic Date Due   DTaP/Tdap/Td (2 - Td or Tdap) 05/15/2020   Zoster Vaccines- Shingrix (2 of 2) 02/24/2022   Medicare Annual Wellness (AWV)  06/14/2023   COVID-19 Vaccine (5 - 2025-26 season) 11/21/2023   Colonoscopy  06/27/2031   Pneumococcal Vaccine: 50+ Years  Completed   Influenza Vaccine  Completed   HPV VACCINES (No Doses Required) Completed   Hepatitis C Screening  Completed   HIV Screening  Completed   Hepatitis B Vaccines 19-59 Average Risk  Aged Out   Meningococcal B Vaccine  Aged Out   Mammogram  Discontinued        Assessment/Plan:  This is a routine wellness examination for Jacqueline Bowman.  Patient Care Team: Myrla Jon HERO, MD as PCP - General (Family Medicine) Cindie Jesusa HERO, RN as Registered Nurse Dannielle Arlean FALCON, RN (Inactive) as Registered Nurse Pa, [facility])  I have personally reviewed and noted the following in the patients chart:   Medical and social history Use of alcohol, tobacco or illicit drugs  Current medications and supplements including opioid prescriptions. Functional ability and status Nutritional status Physical activity Advanced directives List of other physicians Hospitalizations, surgeries, and ER visits in previous 12 months Vitals Screenings to include cognitive, depression, and falls Referrals and appointments  No orders of the defined types were  placed in this encounter.  In addition, I have reviewed and discussed with patient certain preventive protocols, quality metrics, and best practice recommendations. A written personalized care plan for preventive services as well as general preventive health recommendations were provided to patient.   Jhonnie GORMAN Das, LPN   [id number]/7973   No follow-ups on file.  After Visit Summary: (MyChart) Due to this being a telephonic visit, the after visit summary with patients personalized plan was offered to patient via MyChart   Nurse Notes: MAMMOGRAMS HAVE BEEN D/C; UTD ON SHOTS EXCEPT TDAP; UTD ON COLONOSCOPY  "

## 2024-04-10 NOTE — Patient Instructions (Addendum)
 Jacqueline Bowman,  Thank you for taking the time for your Medicare Wellness Visit. I appreciate your continued commitment to your health goals. Please review the care plan we discussed, and feel free to reach out if I can assist you further.  Please note that Annual Wellness Visits do not include a physical exam. Some assessments may be limited, especially if the visit was conducted virtually. If needed, we may recommend an in-person follow-up with your provider.  Ongoing Care Seeing your primary care provider every 3 to 6 months helps us  monitor your health and provide consistent, personalized care. APPT FOR PHYSICAL ON 01/14/25 @ 2:40 PM W/ [email or web address]  Referrals If a referral was made during today's visit and you haven't received any updates within two weeks, please contact the referred provider directly to check on the status.  Recommended Screenings:  Health Maintenance  Topic Date Due   DTaP/Tdap/Td vaccine (2 - Td or Tdap) 05/15/2020   Zoster (Shingles) Vaccine (2 of 2) 02/24/2022   Medicare Annual Wellness Visit  06/14/2023   COVID-19 Vaccine (5 - 2025-26 season) 11/21/2023   Colon Cancer Screening  06/27/2031   Pneumococcal Vaccine for age over 27  Completed   Flu Shot  Completed   HPV Vaccine (No Doses Required) Completed   Hepatitis C Screening  Completed   HIV Screening  Completed   Hepatitis B Vaccine  Aged Out   Meningitis B Vaccine  Aged Out   Breast Cancer Screening  Discontinued      Vision: Annual vision screenings are recommended for early detection of glaucoma, cataracts, and diabetic retinopathy. These exams can also reveal signs of chronic conditions such as diabetes and high blood pressure.  Dental: Annual dental screenings help detect early signs of oral cancer, gum disease, and other conditions linked to overall health, including heart disease and diabetes.  Please see the attached documents for additional preventive care recommendations.   NEXT AWV 04/16/25  @ 8:10 AM IN PERSON

## 2025-01-14 ENCOUNTER — Encounter: Admitting: Family Medicine

## 2025-04-16 ENCOUNTER — Ambulatory Visit
